# Patient Record
Sex: Female | Born: 1975 | Race: Black or African American | Hispanic: No | Marital: Single | Smoking: Current every day smoker
Health system: Southern US, Community
[De-identification: ages and names within clinical notes are randomized; demographics above are authoritative.]

## PROBLEM LIST (undated history)

## (undated) DIAGNOSIS — F32A Depression, unspecified: Secondary | ICD-10-CM

## (undated) DIAGNOSIS — F419 Anxiety disorder, unspecified: Secondary | ICD-10-CM

## (undated) DIAGNOSIS — F329 Major depressive disorder, single episode, unspecified: Secondary | ICD-10-CM

---

## 2007-04-03 ENCOUNTER — Emergency Department: Payer: Self-pay | Admitting: Emergency Medicine

## 2007-04-06 ENCOUNTER — Emergency Department: Payer: Self-pay | Admitting: Emergency Medicine

## 2007-08-31 ENCOUNTER — Observation Stay: Payer: Self-pay | Admitting: Obstetrics and Gynecology

## 2007-11-08 ENCOUNTER — Ambulatory Visit: Payer: Self-pay | Admitting: Family Medicine

## 2007-11-09 ENCOUNTER — Inpatient Hospital Stay: Payer: Self-pay | Admitting: Obstetrics and Gynecology

## 2008-01-22 ENCOUNTER — Ambulatory Visit: Payer: Self-pay | Admitting: Unknown Physician Specialty

## 2009-05-27 ENCOUNTER — Emergency Department: Payer: Self-pay | Admitting: Emergency Medicine

## 2009-08-15 ENCOUNTER — Emergency Department: Payer: Self-pay | Admitting: Emergency Medicine

## 2010-01-26 ENCOUNTER — Emergency Department: Payer: Self-pay | Admitting: Internal Medicine

## 2010-02-19 ENCOUNTER — Emergency Department: Payer: Self-pay | Admitting: Emergency Medicine

## 2010-02-20 ENCOUNTER — Emergency Department: Payer: Self-pay | Admitting: Emergency Medicine

## 2011-04-02 ENCOUNTER — Emergency Department: Payer: Self-pay | Admitting: Emergency Medicine

## 2011-10-28 ENCOUNTER — Emergency Department: Payer: Self-pay | Admitting: Emergency Medicine

## 2011-10-28 LAB — COMPREHENSIVE METABOLIC PANEL
Albumin: 3.8 g/dL (ref 3.4–5.0)
Alkaline Phosphatase: 77 U/L (ref 50–136)
Anion Gap: 9 (ref 7–16)
BUN: 7 mg/dL (ref 7–18)
Bilirubin,Total: 0.4 mg/dL (ref 0.2–1.0)
Calcium, Total: 8.7 mg/dL (ref 8.5–10.1)
Chloride: 106 mmol/L (ref 98–107)
Co2: 25 mmol/L (ref 21–32)
Creatinine: 0.67 mg/dL (ref 0.60–1.30)
EGFR (African American): >60
EGFR (Non-African Amer.): >60
Glucose: 66 mg/dL (ref 65–99)
Osmolality: 276 (ref 275–301)
Potassium: 3.6 mmol/L (ref 3.5–5.1)
SGOT(AST): 19 U/L (ref 15–37)
SGPT (ALT): 15 U/L
Sodium: 140 mmol/L (ref 136–145)
Total Protein: 7.2 g/dL (ref 6.4–8.2)

## 2011-10-28 LAB — TROPONIN I: Troponin-I: <0.02 ng/mL

## 2011-10-28 LAB — CBC
HCT: 43.9 % (ref 35.0–47.0)
HGB: 14.4 g/dL (ref 12.0–16.0)
MCH: 29.5 pg (ref 26.0–34.0)
MCHC: 32.7 g/dL (ref 32.0–36.0)
MCV: 90 fL (ref 80–100)
Platelet: 202 10*3/uL (ref 150–440)
RBC: 4.87 10*6/uL (ref 3.80–5.20)
RDW: 13.8 % (ref 11.5–14.5)
WBC: 10.7 10*3/uL (ref 3.6–11.0)

## 2011-10-28 LAB — CK TOTAL AND CKMB (NOT AT ARMC)
CK, Total: 74 U/L (ref 21–215)
CK-MB: <0.5 ng/mL — ABNORMAL LOW (ref 0.5–3.6)

## 2012-01-15 ENCOUNTER — Emergency Department: Payer: Self-pay | Admitting: Unknown Physician Specialty

## 2012-01-15 LAB — URINALYSIS, COMPLETE
Bacteria: NONE SEEN
Bilirubin,UR: NEGATIVE
Blood: NEGATIVE
Glucose,UR: NEGATIVE mg/dL (ref 0–75)
Ketone: NEGATIVE
Leukocyte Esterase: NEGATIVE
Nitrite: NEGATIVE
Ph: 7 (ref 4.5–8.0)
Protein: NEGATIVE
RBC,UR: 1 /HPF (ref 0–5)
Specific Gravity: 1.01 (ref 1.003–1.030)
Squamous Epithelial: 6
WBC UR: <1 /HPF (ref 0–5)

## 2012-01-15 LAB — DRUG SCREEN, URINE
Amphetamines, Ur Screen: NEGATIVE (ref ?–1000)
Barbiturates, Ur Screen: NEGATIVE (ref ?–200)
Benzodiazepine, Ur Scrn: NEGATIVE (ref ?–200)
Cannabinoid 50 Ng, Ur ~~LOC~~: POSITIVE (ref ?–50)
Cocaine Metabolite,Ur ~~LOC~~: NEGATIVE (ref ?–300)
MDMA (Ecstasy)Ur Screen: NEGATIVE (ref ?–500)
Methadone, Ur Screen: NEGATIVE (ref ?–300)
Opiate, Ur Screen: NEGATIVE (ref ?–300)
Phencyclidine (PCP) Ur S: NEGATIVE (ref ?–25)
Tricyclic, Ur Screen: NEGATIVE (ref ?–1000)

## 2012-01-15 LAB — CBC
HCT: 38.6 % (ref 35.0–47.0)
HGB: 12.7 g/dL (ref 12.0–16.0)
MCH: 28.9 pg (ref 26.0–34.0)
MCHC: 32.9 g/dL (ref 32.0–36.0)
MCV: 88 fL (ref 80–100)
Platelet: 218 10*3/uL (ref 150–440)
RBC: 4.39 10*6/uL (ref 3.80–5.20)
RDW: 13.2 % (ref 11.5–14.5)
WBC: 11.4 10*3/uL — ABNORMAL HIGH (ref 3.6–11.0)

## 2012-01-15 LAB — COMPREHENSIVE METABOLIC PANEL
Albumin: 3.8 g/dL (ref 3.4–5.0)
Alkaline Phosphatase: 92 U/L (ref 50–136)
Anion Gap: 10 (ref 7–16)
BUN: 8 mg/dL (ref 7–18)
Bilirubin,Total: 0.9 mg/dL (ref 0.2–1.0)
Calcium, Total: 8.7 mg/dL (ref 8.5–10.1)
Chloride: 104 mmol/L (ref 98–107)
Co2: 24 mmol/L (ref 21–32)
Creatinine: 0.6 mg/dL (ref 0.60–1.30)
EGFR (African American): >60
EGFR (Non-African Amer.): >60
Glucose: 96 mg/dL (ref 65–99)
Osmolality: 274 (ref 275–301)
Potassium: 3.1 mmol/L — ABNORMAL LOW (ref 3.5–5.1)
SGOT(AST): 19 U/L (ref 15–37)
SGPT (ALT): 13 U/L
Sodium: 138 mmol/L (ref 136–145)
Total Protein: 7.6 g/dL (ref 6.4–8.2)

## 2012-01-15 LAB — LIPASE, BLOOD: Lipase: 57 U/L — ABNORMAL LOW (ref 73–393)

## 2012-01-15 LAB — PREGNANCY, URINE: Pregnancy Test, Urine: POSITIVE m[IU]/mL

## 2012-01-19 ENCOUNTER — Emergency Department: Payer: Self-pay | Admitting: Emergency Medicine

## 2012-01-19 LAB — URINALYSIS, COMPLETE
Bacteria: NONE SEEN
Bilirubin,UR: NEGATIVE
Blood: NEGATIVE
Glucose,UR: NEGATIVE mg/dL (ref 0–75)
Nitrite: NEGATIVE
Ph: 5 (ref 4.5–8.0)
Protein: 30
RBC,UR: 5 /HPF (ref 0–5)
Specific Gravity: 1.014 (ref 1.003–1.030)
Squamous Epithelial: 15
WBC UR: 4 /HPF (ref 0–5)

## 2012-04-19 ENCOUNTER — Emergency Department: Payer: Self-pay | Admitting: Emergency Medicine

## 2012-04-19 LAB — URINALYSIS, COMPLETE
Bacteria: NONE SEEN
Bilirubin,UR: NEGATIVE
Blood: NEGATIVE
Glucose,UR: NEGATIVE mg/dL (ref 0–75)
Ketone: NEGATIVE
Leukocyte Esterase: NEGATIVE
Nitrite: NEGATIVE
Ph: 9 (ref 4.5–8.0)
Protein: NEGATIVE
RBC,UR: 1 /HPF (ref 0–5)
Specific Gravity: 1.01 (ref 1.003–1.030)
Squamous Epithelial: 1
WBC UR: 2 /HPF (ref 0–5)

## 2012-05-24 ENCOUNTER — Emergency Department: Payer: Self-pay | Admitting: Internal Medicine

## 2012-05-24 LAB — URINALYSIS, COMPLETE
Bilirubin,UR: NEGATIVE
Glucose,UR: NEGATIVE mg/dL (ref 0–75)
Ketone: NEGATIVE
Nitrite: NEGATIVE
Ph: 6 (ref 4.5–8.0)
Protein: NEGATIVE
RBC,UR: 10 /HPF (ref 0–5)
Specific Gravity: 1.02 (ref 1.003–1.030)
Squamous Epithelial: 2
Transitional Epi: 1
WBC UR: 34 /HPF (ref 0–5)

## 2012-05-24 LAB — COMPREHENSIVE METABOLIC PANEL
Albumin: 3.7 g/dL (ref 3.4–5.0)
Alkaline Phosphatase: 102 U/L (ref 50–136)
Anion Gap: 7 (ref 7–16)
BUN: 9 mg/dL (ref 7–18)
Bilirubin,Total: 0.4 mg/dL (ref 0.2–1.0)
Calcium, Total: 8.3 mg/dL — ABNORMAL LOW (ref 8.5–10.1)
Chloride: 107 mmol/L (ref 98–107)
Co2: 24 mmol/L (ref 21–32)
Creatinine: 0.57 mg/dL — ABNORMAL LOW (ref 0.60–1.30)
EGFR (African American): >60
EGFR (Non-African Amer.): >60
Glucose: 87 mg/dL (ref 65–99)
Osmolality: 274 (ref 275–301)
Potassium: 3.3 mmol/L — ABNORMAL LOW (ref 3.5–5.1)
SGOT(AST): 25 U/L (ref 15–37)
SGPT (ALT): 21 U/L (ref 12–78)
Sodium: 138 mmol/L (ref 136–145)
Total Protein: 7 g/dL (ref 6.4–8.2)

## 2012-05-24 LAB — CBC
HCT: 39.2 % (ref 35.0–47.0)
HGB: 12.5 g/dL (ref 12.0–16.0)
MCH: 28.1 pg (ref 26.0–34.0)
MCHC: 32 g/dL (ref 32.0–36.0)
MCV: 88 fL (ref 80–100)
Platelet: 196 10*3/uL (ref 150–440)
RBC: 4.46 10*6/uL (ref 3.80–5.20)
RDW: 13.8 % (ref 11.5–14.5)
WBC: 10.8 10*3/uL (ref 3.6–11.0)

## 2012-05-24 LAB — LIPASE, BLOOD: Lipase: 88 U/L (ref 73–393)

## 2012-07-05 HISTORY — PX: FACIAL COSMETIC SURGERY: SHX629

## 2012-11-12 DIAGNOSIS — F4310 Post-traumatic stress disorder, unspecified: Secondary | ICD-10-CM | POA: Insufficient documentation

## 2012-11-12 DIAGNOSIS — F1210 Cannabis abuse, uncomplicated: Secondary | ICD-10-CM | POA: Insufficient documentation

## 2012-11-12 DIAGNOSIS — F419 Anxiety disorder, unspecified: Secondary | ICD-10-CM | POA: Insufficient documentation

## 2012-11-12 DIAGNOSIS — F329 Major depressive disorder, single episode, unspecified: Secondary | ICD-10-CM | POA: Insufficient documentation

## 2013-01-19 DIAGNOSIS — S0292XA Unspecified fracture of facial bones, initial encounter for closed fracture: Secondary | ICD-10-CM | POA: Insufficient documentation

## 2013-05-03 ENCOUNTER — Emergency Department: Payer: Self-pay | Admitting: Emergency Medicine

## 2013-05-03 LAB — BASIC METABOLIC PANEL
Anion Gap: 6 — ABNORMAL LOW (ref 7–16)
BUN: 11 mg/dL (ref 7–18)
Calcium, Total: 8.8 mg/dL (ref 8.5–10.1)
Chloride: 105 mmol/L (ref 98–107)
Co2: 27 mmol/L (ref 21–32)
Creatinine: 0.54 mg/dL — ABNORMAL LOW (ref 0.60–1.30)
EGFR (African American): >60
EGFR (Non-African Amer.): >60
Glucose: 110 mg/dL — ABNORMAL HIGH (ref 65–99)
Osmolality: 276 (ref 275–301)
Potassium: 3.1 mmol/L — ABNORMAL LOW (ref 3.5–5.1)
Sodium: 138 mmol/L (ref 136–145)

## 2013-05-03 LAB — CBC WITH DIFFERENTIAL/PLATELET
Basophil #: 0 10*3/uL (ref 0.0–0.1)
Basophil %: 0.2 %
Eosinophil #: 0 10*3/uL (ref 0.0–0.7)
Eosinophil %: 0.2 %
HCT: 40.3 % (ref 35.0–47.0)
HGB: 14 g/dL (ref 12.0–16.0)
Lymphocyte #: 1.2 10*3/uL (ref 1.0–3.6)
Lymphocyte %: 5.7 %
MCH: 29.4 pg (ref 26.0–34.0)
MCHC: 34.7 g/dL (ref 32.0–36.0)
MCV: 85 fL (ref 80–100)
Monocyte #: 0.9 x10 3/mm (ref 0.2–0.9)
Monocyte %: 4.2 %
Neutrophil #: 18.4 10*3/uL — ABNORMAL HIGH (ref 1.4–6.5)
Neutrophil %: 89.7 %
Platelet: 236 10*3/uL (ref 150–440)
RBC: 4.76 10*6/uL (ref 3.80–5.20)
RDW: 14.4 % (ref 11.5–14.5)
WBC: 20.5 10*3/uL — ABNORMAL HIGH (ref 3.6–11.0)

## 2013-07-31 ENCOUNTER — Emergency Department: Payer: Self-pay | Admitting: Emergency Medicine

## 2013-12-20 DIAGNOSIS — L720 Epidermal cyst: Secondary | ICD-10-CM | POA: Insufficient documentation

## 2013-12-22 ENCOUNTER — Emergency Department: Payer: Self-pay | Admitting: Emergency Medicine

## 2014-02-22 DIAGNOSIS — D369 Benign neoplasm, unspecified site: Secondary | ICD-10-CM | POA: Insufficient documentation

## 2014-04-21 ENCOUNTER — Emergency Department: Payer: Self-pay | Admitting: Emergency Medicine

## 2014-04-21 LAB — URINALYSIS, COMPLETE
Bacteria: NONE SEEN
Bilirubin,UR: NEGATIVE
Blood: NEGATIVE
Glucose,UR: NEGATIVE mg/dL (ref 0–75)
Ketone: NEGATIVE
Leukocyte Esterase: NEGATIVE
Nitrite: NEGATIVE
Ph: 7 (ref 4.5–8.0)
Protein: NEGATIVE
RBC,UR: <1 /HPF (ref 0–5)
Specific Gravity: 1.002 (ref 1.003–1.030)
Squamous Epithelial: <1
WBC UR: <1 /HPF (ref 0–5)

## 2014-07-28 ENCOUNTER — Emergency Department: Payer: Self-pay | Admitting: Emergency Medicine

## 2014-07-30 ENCOUNTER — Emergency Department: Payer: Self-pay | Admitting: Internal Medicine

## 2014-07-30 LAB — DRUG SCREEN, URINE
Amphetamines, Ur Screen: NEGATIVE (ref ?–1000)
Barbiturates, Ur Screen: NEGATIVE (ref ?–200)
Benzodiazepine, Ur Scrn: NEGATIVE (ref ?–200)
Cannabinoid 50 Ng, Ur ~~LOC~~: POSITIVE (ref ?–50)
Cocaine Metabolite,Ur ~~LOC~~: NEGATIVE (ref ?–300)
MDMA (Ecstasy)Ur Screen: NEGATIVE (ref ?–500)
Methadone, Ur Screen: NEGATIVE (ref ?–300)
Opiate, Ur Screen: NEGATIVE (ref ?–300)
Phencyclidine (PCP) Ur S: NEGATIVE (ref ?–25)
Tricyclic, Ur Screen: NEGATIVE (ref ?–1000)

## 2014-07-30 LAB — COMPREHENSIVE METABOLIC PANEL
Albumin: 3.9 g/dL (ref 3.4–5.0)
Alkaline Phosphatase: 89 U/L (ref 46–116)
Anion Gap: 8 (ref 7–16)
BUN: 10 mg/dL (ref 7–18)
Bilirubin,Total: 0.3 mg/dL (ref 0.2–1.0)
Calcium, Total: 8.7 mg/dL (ref 8.5–10.1)
Chloride: 105 mmol/L (ref 98–107)
Co2: 25 mmol/L (ref 21–32)
Creatinine: 0.74 mg/dL (ref 0.60–1.30)
EGFR (African American): >60
EGFR (Non-African Amer.): >60
Glucose: 87 mg/dL (ref 65–99)
Osmolality: 274 (ref 275–301)
Potassium: 3.5 mmol/L (ref 3.5–5.1)
SGOT(AST): 24 U/L (ref 15–37)
SGPT (ALT): 17 U/L (ref 14–63)
Sodium: 138 mmol/L (ref 136–145)
Total Protein: 7.2 g/dL (ref 6.4–8.2)

## 2014-07-30 LAB — URINALYSIS, COMPLETE
Bacteria: NONE SEEN
Bilirubin,UR: NEGATIVE
Blood: NEGATIVE
Glucose,UR: NEGATIVE mg/dL (ref 0–75)
Hyaline Cast: 14
Ketone: NEGATIVE
Leukocyte Esterase: NEGATIVE
Nitrite: NEGATIVE
Ph: 6 (ref 4.5–8.0)
Protein: NEGATIVE
RBC,UR: 3 /HPF (ref 0–5)
Specific Gravity: 1.016 (ref 1.003–1.030)
Squamous Epithelial: <1
WBC UR: 3 /HPF (ref 0–5)

## 2014-07-30 LAB — CBC
HCT: 42.7 % (ref 35.0–47.0)
HGB: 13.9 g/dL (ref 12.0–16.0)
MCH: 28.5 pg (ref 26.0–34.0)
MCHC: 32.7 g/dL (ref 32.0–36.0)
MCV: 87 fL (ref 80–100)
Platelet: 194 10*3/uL (ref 150–440)
RBC: 4.89 10*6/uL (ref 3.80–5.20)
RDW: 13.6 % (ref 11.5–14.5)
WBC: 7.9 10*3/uL (ref 3.6–11.0)

## 2014-07-30 LAB — D-DIMER(ARMC): D-Dimer: 337 ng/ml

## 2014-07-30 LAB — TSH: Thyroid Stimulating Horm: 0.648 u[IU]/mL

## 2014-07-30 LAB — TROPONIN I: Troponin-I: <0.02 ng/mL

## 2014-09-23 DIAGNOSIS — F32A Depression, unspecified: Secondary | ICD-10-CM | POA: Insufficient documentation

## 2014-09-24 ENCOUNTER — Emergency Department: Payer: Self-pay | Admitting: Emergency Medicine

## 2015-03-29 IMAGING — CT CT HEAD WITHOUT CONTRAST
1 series · 16 of 30 positions shown, 20 images · non-contrast
Comparison: 04/19/2012.

CLINICAL DATA: 38-year-old female with episode of becoming hot and
dizzy and found lying on floor. No trauma. No loss of consciousness.
Initial encounter.

EXAM:
CT HEAD WITHOUT CONTRAST
TECHNIQUE: Contiguous axial images were obtained from the base of the skull
through the vertex without intravenous contrast.

[Series 2: soft tissue · axial · 0.38mm/px · z∈[-161,-26]mm · 16 of 31 slices shown, 20 images]
[im 2/31  brain]
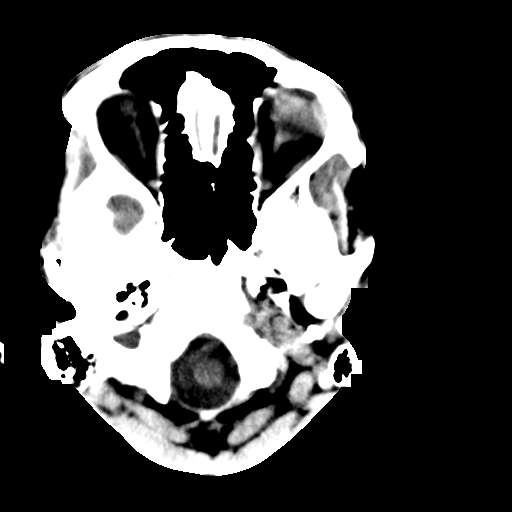
[im 2/31  bone]
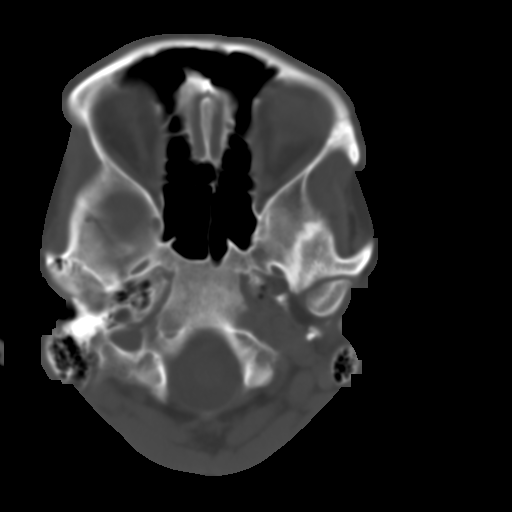
[im 4/31  brain]
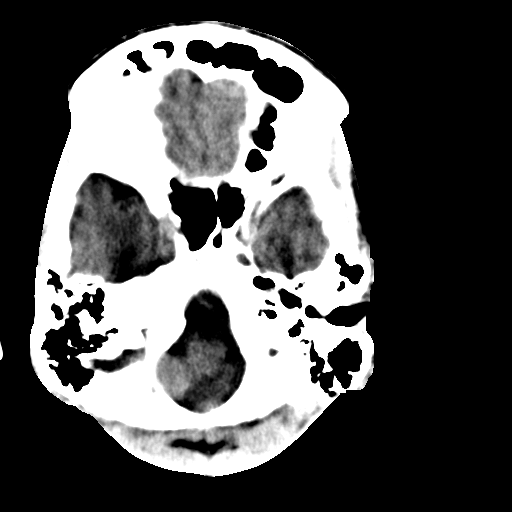
[im 6/31  brain]
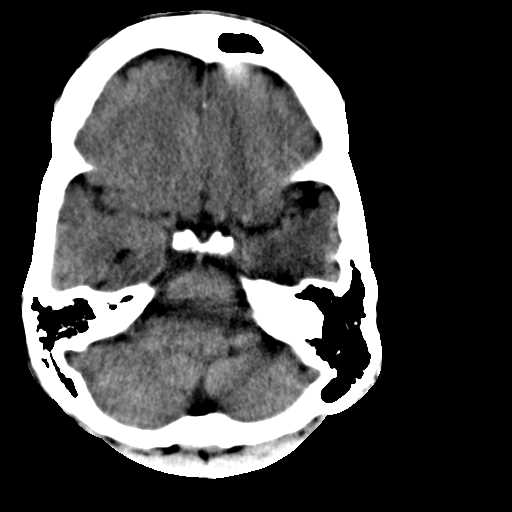
[im 8/31  brain]
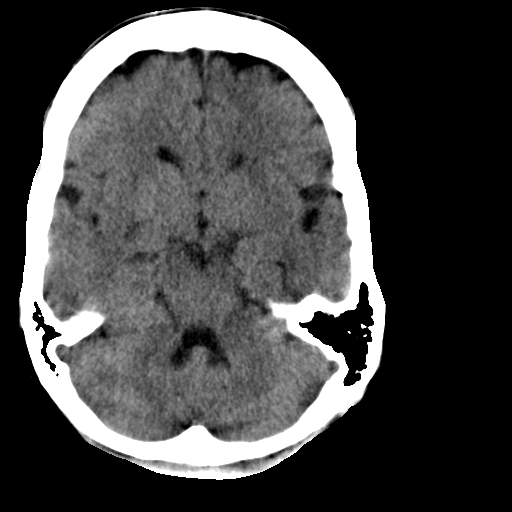
[im 9/31  brain]
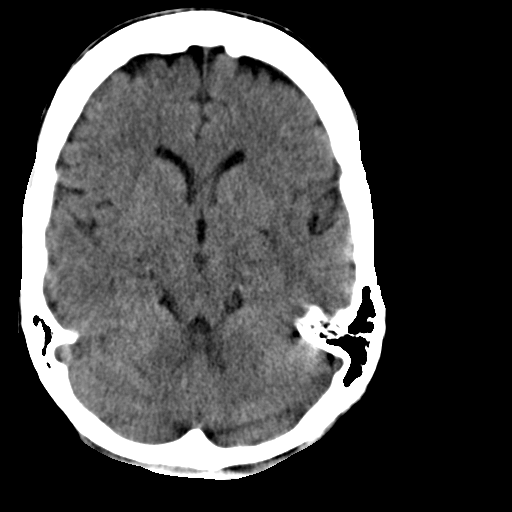
[im 9/31  bone]
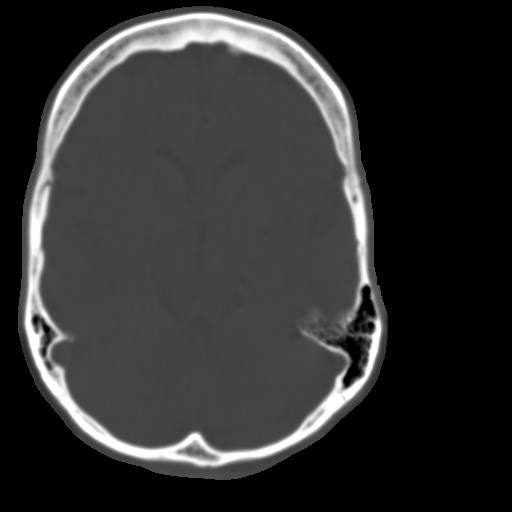
[im 11/31  brain]
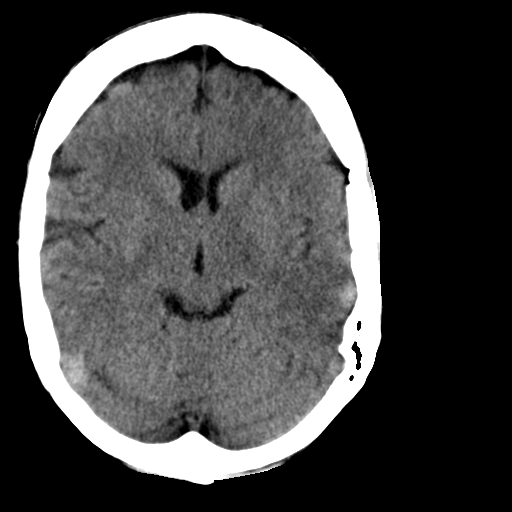
[im 13/31  brain]
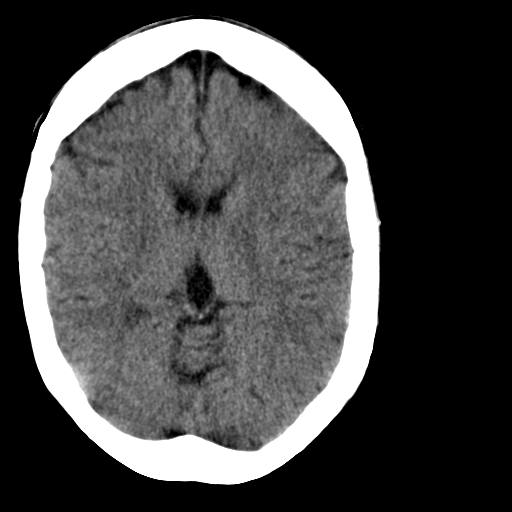
[im 15/31  brain]
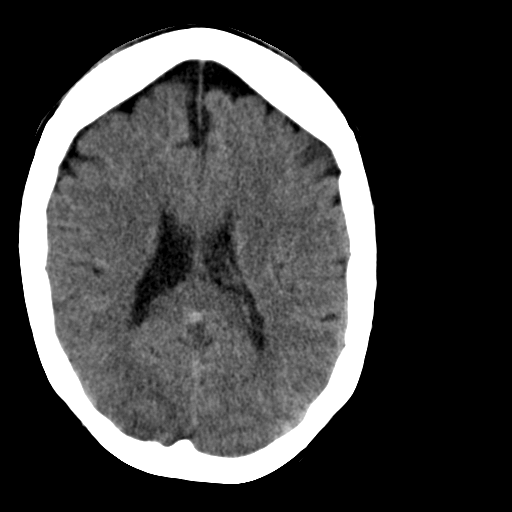
[im 16/31  brain]
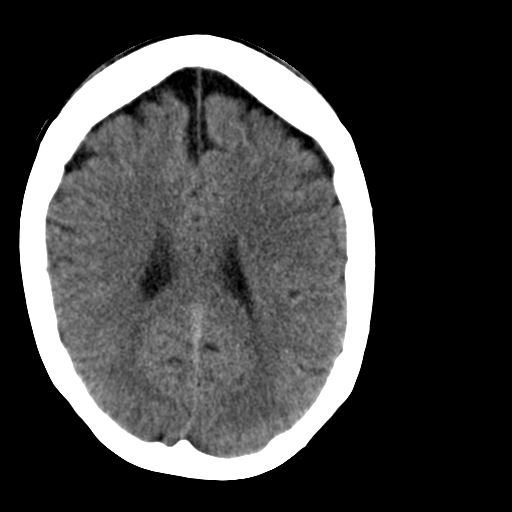
[im 16/31  bone]
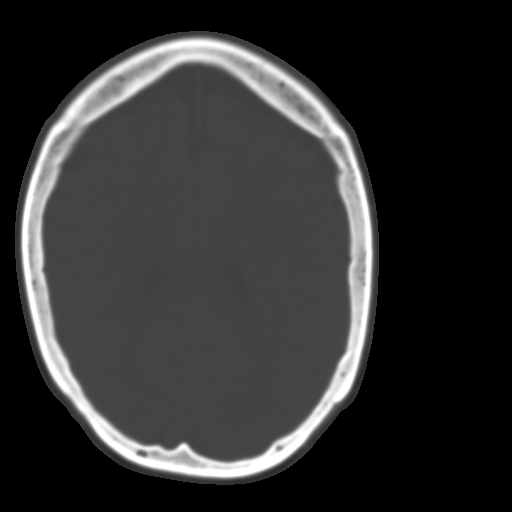
[im 18/31  brain]
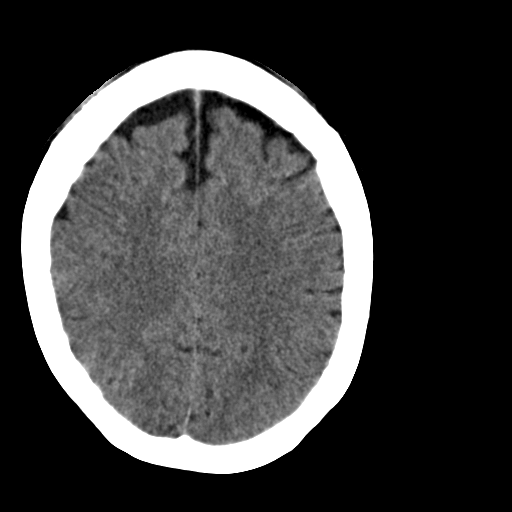
[im 20/31  brain]
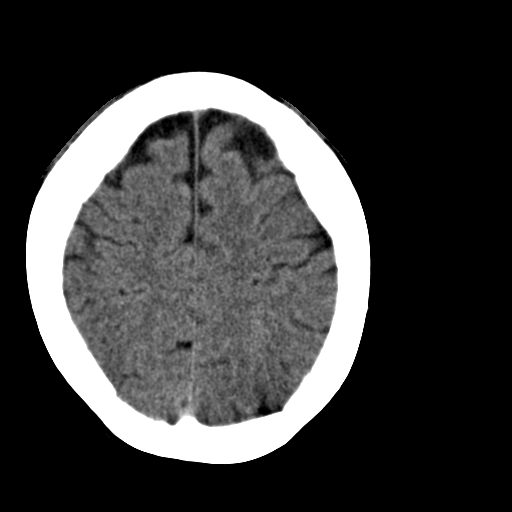
[im 22/31  brain]
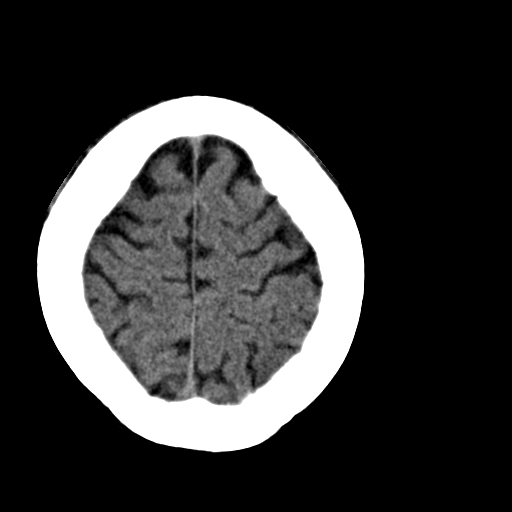
[im 23/31  brain]
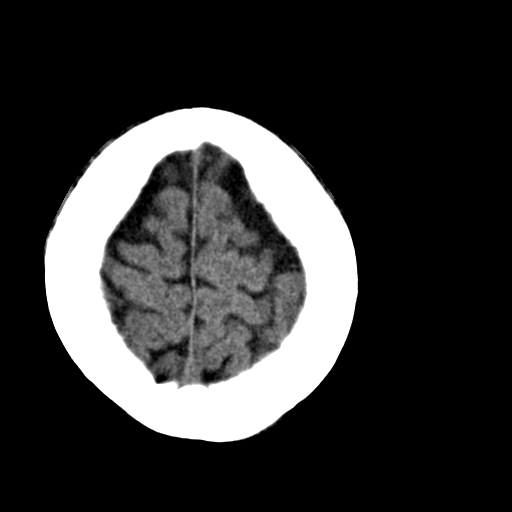
[im 23/31  bone]
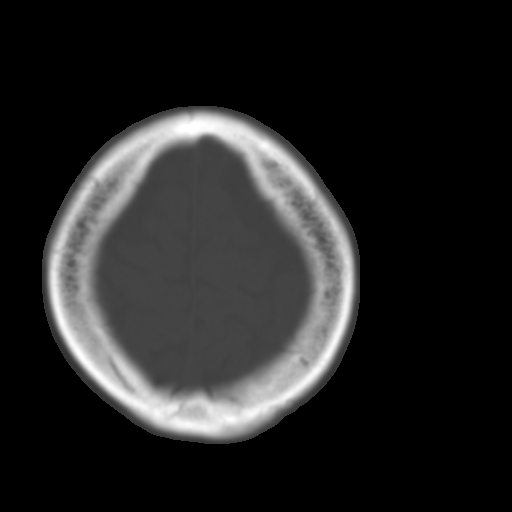
[im 25/31  brain]
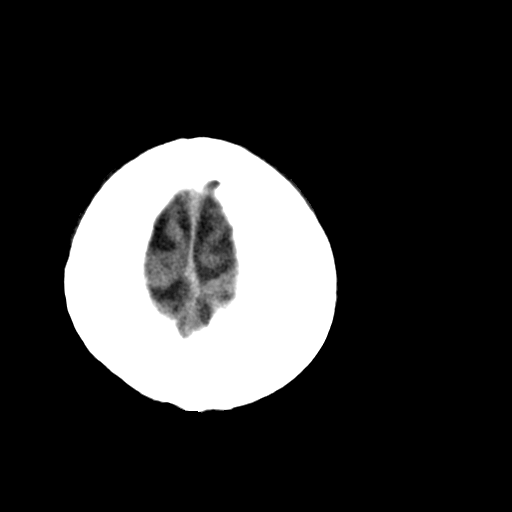
[im 27/31  brain]
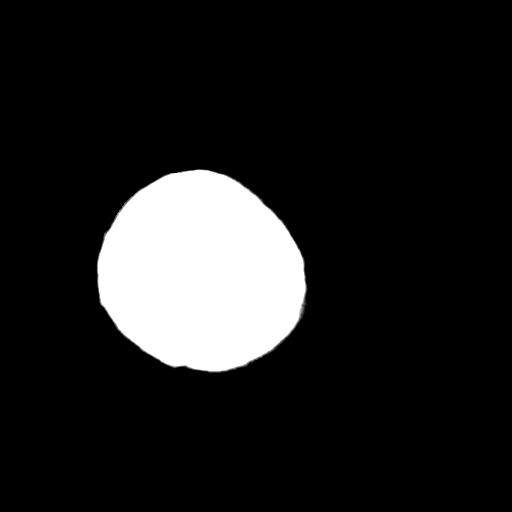
[im 29/31  brain]
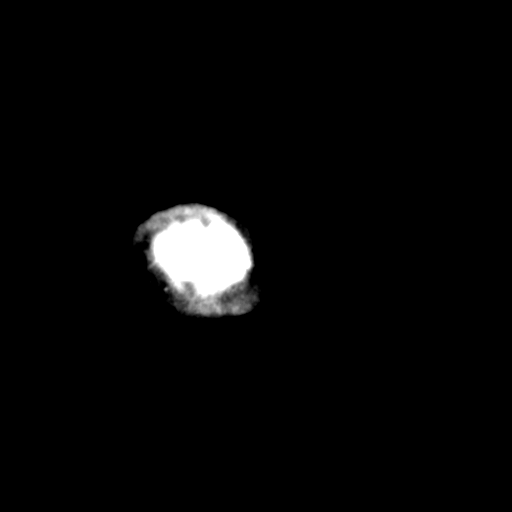

[16 of 30 positions shown; findings below may reference images not displayed]

FINDINGS: Evaluation slightly limited by artifact caused by patient's
calvarium/motion. Taking this limitation into account, no
intracranial hemorrhage is noted.

No CT evidence of large acute infarct.

No hydrocephalus.

No intracranial mass lesion noted on this unenhanced exam.

Mastoid air cells, middle ear cavities and visualized paranasal
sinuses are clear.
IMPRESSION: Evaluation slightly limited by artifact caused by patient's
calvarium/motion. Taking this limitation into account, no
intracranial hemorrhage is noted.

No CT evidence of large acute infarct.

## 2015-03-29 IMAGING — CR DG CHEST 2V
1 series · 2 of 2 positions shown · non-contrast
Comparison: 05/27/2009

CLINICAL DATA: Shortness of breath for 2 days, cough, dizziness at
work today

EXAM:
CHEST  2 VIEW

[Series 1: w chest pa · 0.14mm/px · 2 of 2 slices shown]
[im 1/2]
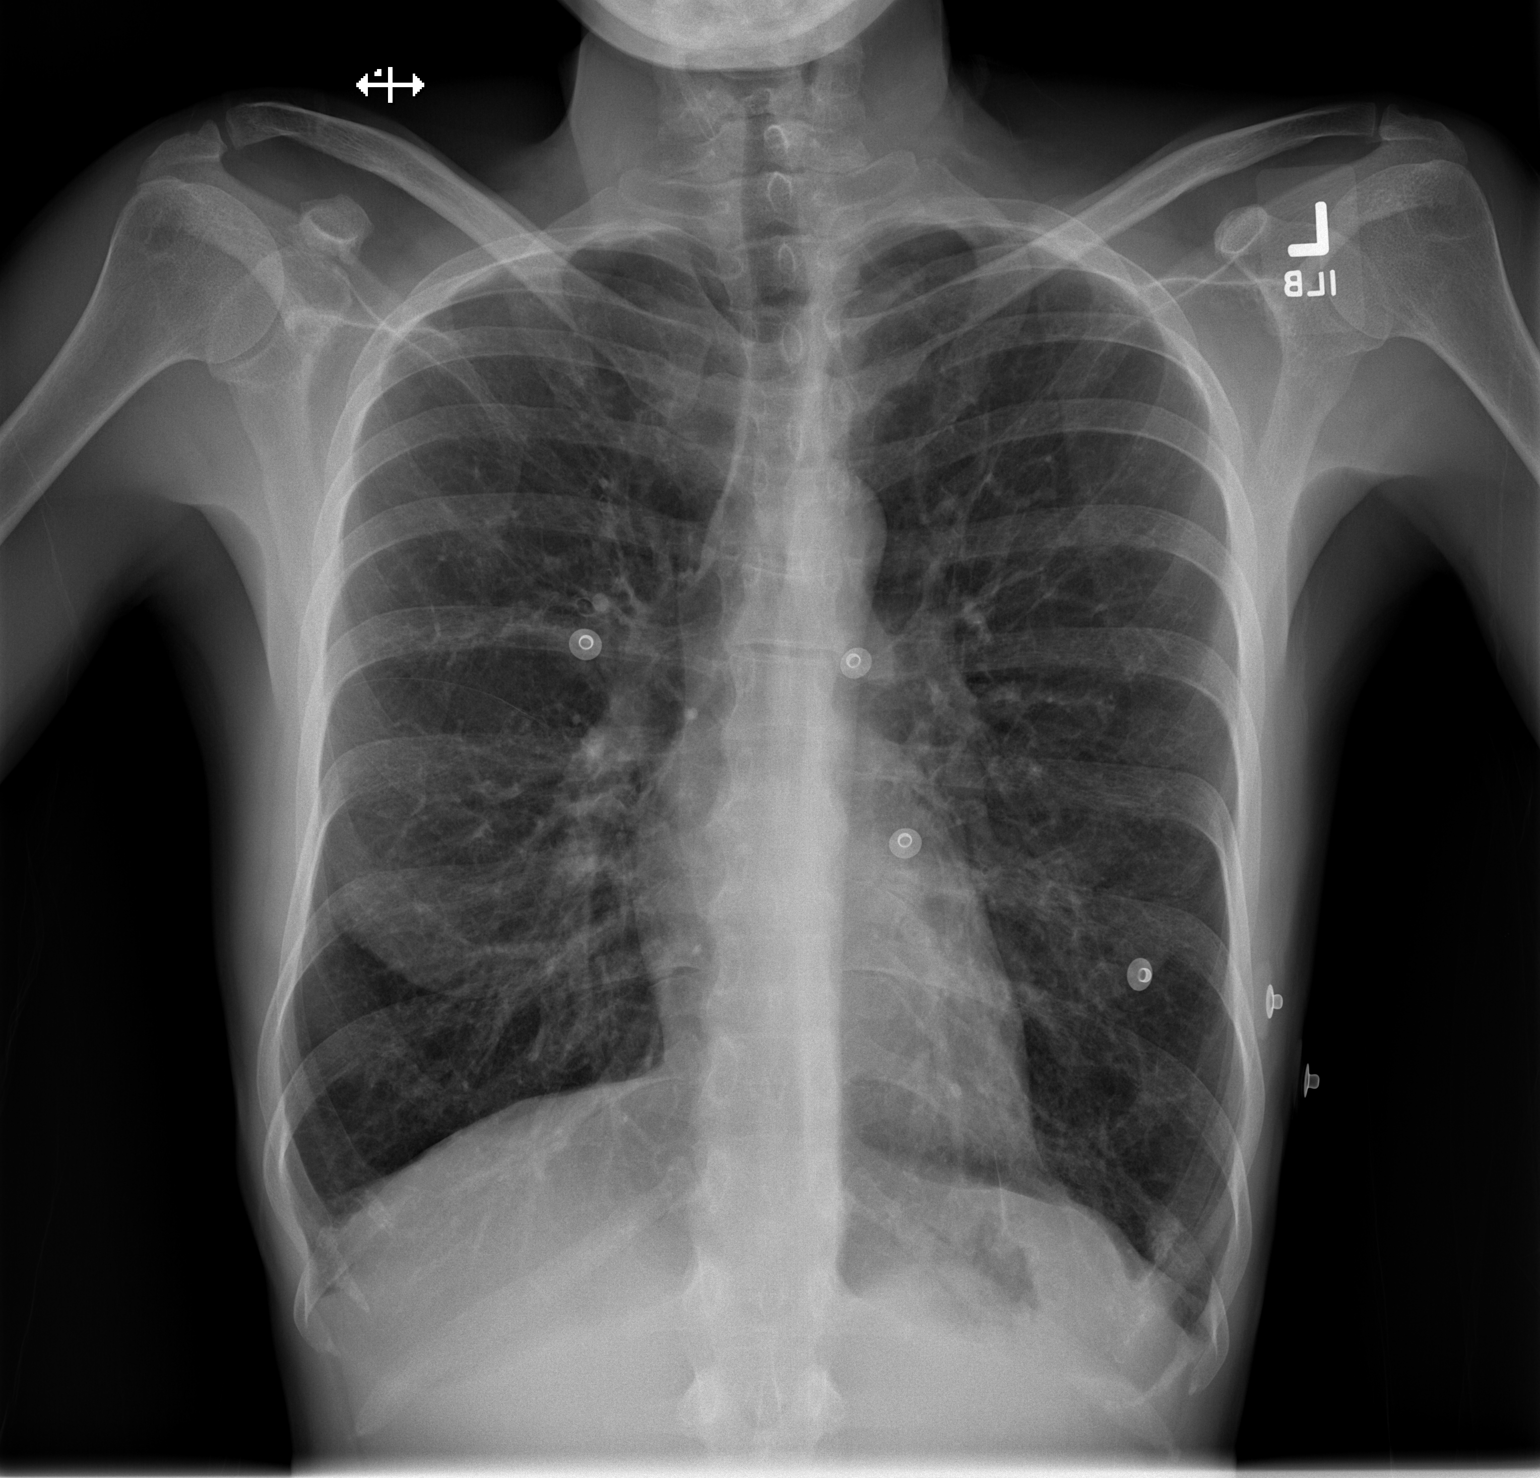
[im 2/2]
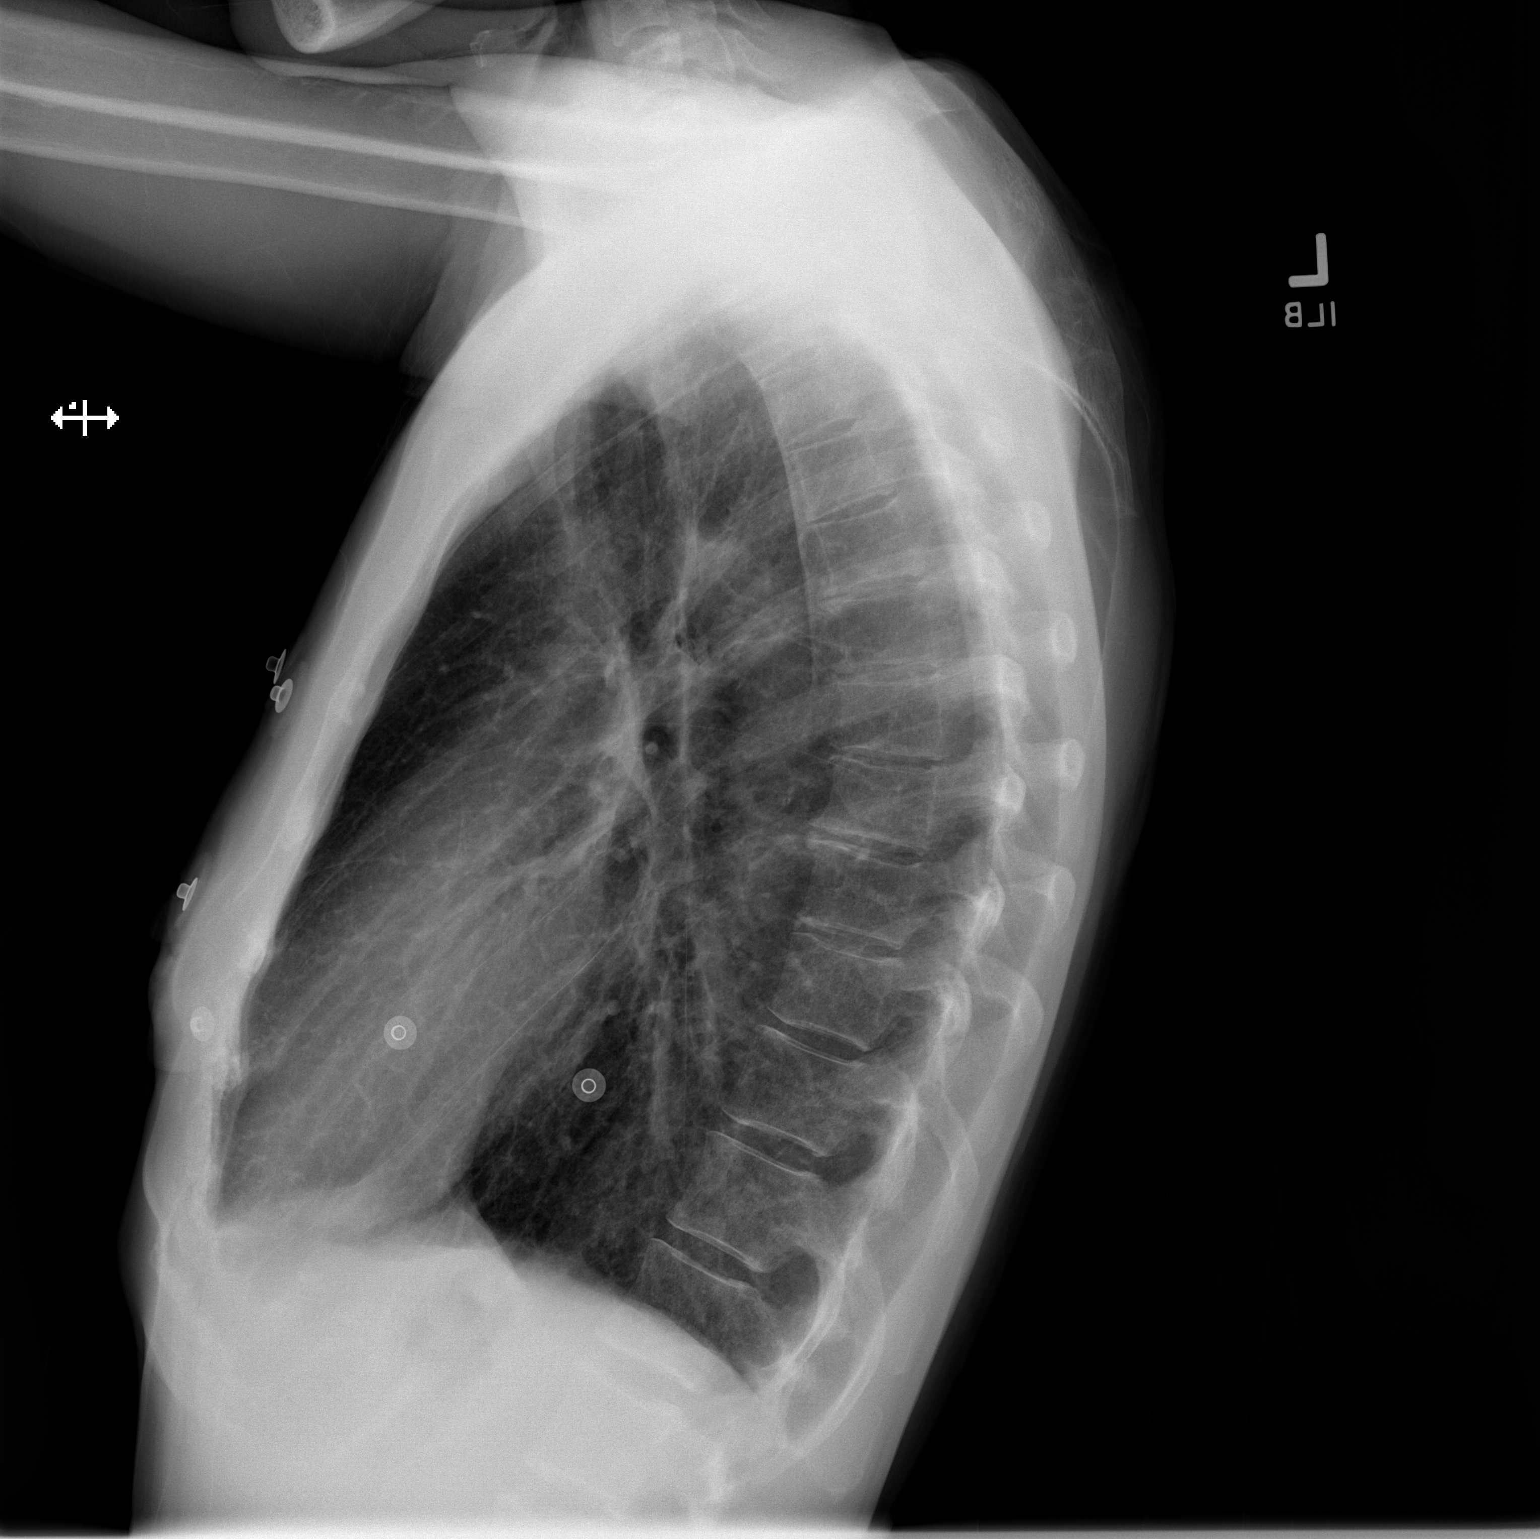

[2 of 2 positions shown; findings below may reference images not displayed]

FINDINGS: Cardiomediastinal silhouette is stable. No acute infiltrate or
pleural effusion. No pulmonary edema. Hyperinflation again noted.
Mild degenerative changes mid thoracic spine.
IMPRESSION: No active cardiopulmonary disease.

## 2015-05-25 ENCOUNTER — Encounter (HOSPITAL_COMMUNITY): Payer: Self-pay

## 2015-05-25 ENCOUNTER — Emergency Department (HOSPITAL_COMMUNITY)
Admission: EM | Admit: 2015-05-25 | Discharge: 2015-05-25 | Disposition: A | Discharge status: Home / Self Care | Payer: Medicaid Other | Attending: Emergency Medicine | Admitting: Emergency Medicine

## 2015-05-25 DIAGNOSIS — M545 Low back pain, unspecified: Secondary | ICD-10-CM

## 2015-05-25 DIAGNOSIS — Z8659 Personal history of other mental and behavioral disorders: Secondary | ICD-10-CM | POA: Insufficient documentation

## 2015-05-25 DIAGNOSIS — F17210 Nicotine dependence, cigarettes, uncomplicated: Secondary | ICD-10-CM | POA: Insufficient documentation

## 2015-05-25 HISTORY — DX: Depression, unspecified: F32.A

## 2015-05-25 HISTORY — DX: Major depressive disorder, single episode, unspecified: F32.9

## 2015-05-25 HISTORY — DX: Anxiety disorder, unspecified: F41.9

## 2015-05-25 MED ORDER — METHOCARBAMOL 500 MG PO TABS
500.0000 mg | ORAL_TABLET | Freq: Once | ORAL | Status: AC
  Administered 2015-05-25: 500 mg via ORAL
  Filled 2015-05-25: qty 1

## 2015-05-25 MED ORDER — IBUPROFEN 400 MG PO TABS
400.0000 mg | ORAL_TABLET | Freq: Once | ORAL | Status: AC
  Administered 2015-05-25: 400 mg via ORAL
  Filled 2015-05-25: qty 1

## 2015-05-25 MED ORDER — METHOCARBAMOL 500 MG PO TABS: 1000.0000 mg | ORAL_TABLET | Freq: Four times a day (QID) | ORAL | Status: DC | PRN

## 2015-05-25 NOTE — Discharge Instructions (Signed)
For pain control you may take up to  of Motrin (also known as ibuprofen). That is usually 4 over the counter pills,  3 times a day. Take with food to minimize stomach irritation   For breakthrough pain you may take Robaxin. Do not drink alcohol, drive or operate heavy machinery when taking Robaxin.  Please follow with your primary care doctor in the next 2 days for a check-up. They must obtain records for further management.   Do not hesitate to return to the Emergency Department for any new, worsening or concerning symptoms.    Back Pain, Adult Back pain is very common in adults.The cause of back pain is rarely dangerous and the pain often gets better over time.The cause of your back pain may not be known. Some common causes of back pain include:  Strain of the muscles or ligaments supporting the spine.  Wear and tear (degeneration) of the spinal disks.  Arthritis.  Direct injury to the back. For many people, back pain may return. Since back pain is rarely dangerous, most people can learn to manage this condition on their own. HOME CARE INSTRUCTIONS Watch your back pain for any changes. The following actions may help to lessen any discomfort you are feeling:  Remain active. It is stressful on your back to sit or stand in one place for long periods of time. Do not sit, drive, or stand in one place for more than 30 minutes at a time. Take short walks on even surfaces as soon as you are able.Try to increase the length of time you walk each day.  Exercise regularly as directed by your health care provider. Exercise helps your back heal faster. It also helps avoid future injury by keeping your muscles strong and flexible.  Do not stay in bed.Resting more than 1-2 days can delay your recovery.  Pay attention to your body when you bend and lift. The most comfortable positions are those that put less stress on your recovering back. Always use proper lifting techniques,  including:  Bending your knees.  Keeping the load close to your body.  Avoiding twisting.  Find a comfortable position to sleep. Use a firm mattress and lie on your side with your knees slightly bent. If you lie on your back, put a pillow under your knees.  Avoid feeling anxious or stressed.Stress increases muscle tension and can worsen back pain.It is important to recognize when you are anxious or stressed and learn ways to manage it, such as with exercise.  Take medicines only as directed by your health care provider. Over-the-counter medicines to reduce pain and inflammation are often the most helpful.Your health care provider may prescribe muscle relaxant drugs.These medicines help dull your pain so you can more quickly return to your normal activities and healthy exercise.  Apply ice to the injured area:  Put ice in a plastic bag.  Place a towel between your skin and the bag.  Leave the ice on for 20 minutes, 2-3 times a day for the first 2-3 days. After that, ice and heat may be alternated to reduce pain and spasms.  Maintain a healthy weight. Excess weight puts extra stress on your back and makes it difficult to maintain good posture. SEEK MEDICAL CARE IF:  You have pain that is not relieved with rest or medicine.  You have increasing pain going down into the legs or buttocks.  You have pain that does not improve in one week.  You have night pain.  You lose weight.  You have a fever or chills. SEEK IMMEDIATE MEDICAL CARE IF:   You develop new bowel or bladder control problems.  You have unusual weakness or numbness in your arms or legs.  You develop nausea or vomiting.  You develop abdominal pain.  You feel faint.   This information is not intended to replace advice given to you by your health care provider. Make sure you discuss any questions you have with your health care provider.   Document Released: 06/21/2005 Document Revised: 07/12/2014 Document  Reviewed: 10/23/2013 Elsevier Interactive Patient Education [employer] Elsevier Inc.

## 2015-05-25 NOTE — ED Notes (Signed)
Pt. Reports severe back pain starting last night. She identifies pain in lumbar region and wrapping around to her sides, with pressure or just sitting still. Pt. Reports pain has kept her from sleeping and from performing ADLs. Pt. Has tried ibuprofen with no relief.
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
## 2015-07-30 ENCOUNTER — Emergency Department (HOSPITAL_COMMUNITY)
Admission: EM | Admit: 2015-07-30 | Discharge: 2015-07-30 | Disposition: A | Discharge status: Home / Self Care | Payer: Medicaid Other | Attending: Emergency Medicine | Admitting: Emergency Medicine

## 2015-07-30 ENCOUNTER — Encounter (HOSPITAL_COMMUNITY): Payer: Self-pay | Admitting: Neurology

## 2015-07-30 DIAGNOSIS — F17210 Nicotine dependence, cigarettes, uncomplicated: Secondary | ICD-10-CM | POA: Diagnosis not present

## 2015-07-30 DIAGNOSIS — R509 Fever, unspecified: Secondary | ICD-10-CM | POA: Diagnosis present

## 2015-07-30 DIAGNOSIS — Z8659 Personal history of other mental and behavioral disorders: Secondary | ICD-10-CM | POA: Diagnosis not present

## 2015-07-30 DIAGNOSIS — J069 Acute upper respiratory infection, unspecified: Secondary | ICD-10-CM | POA: Diagnosis not present

## 2015-07-30 LAB — RAPID STREP SCREEN (MED CTR MEBANE ONLY): Streptococcus, Group A Screen (Direct): NEGATIVE

## 2015-07-30 MED ORDER — IBUPROFEN 800 MG PO TABS
800.0000 mg | ORAL_TABLET | Freq: Once | ORAL | Status: AC
  Administered 2015-07-30: 800 mg via ORAL
  Filled 2015-07-30: qty 1

## 2015-07-30 NOTE — ED Notes (Signed)
Pt is in stable condition upon d/c and ambulates from ed. 

## 2015-07-30 NOTE — ED Notes (Signed)
Pt reports since Friday h/a, sore throat, body aches, fever, left ear ache. Has been feeling nauseated.
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
## 2015-07-30 NOTE — Discharge Instructions (Signed)
Upper Respiratory Infection, Adult °Most upper respiratory infections (URIs) are a viral infection of the air passages leading to the lungs. A URI affects the nose, throat, and upper air passages. The most common type of URI is nasopharyngitis and is typically referred to as "the common cold." °URIs run their course and usually go away on their own. Most of the time, a URI does not require medical attention, but sometimes a bacterial infection in the upper airways can follow a viral infection. This is called a secondary infection. Sinus and middle ear infections are common types of secondary upper respiratory infections. °Bacterial pneumonia can also complicate a URI. A URI can worsen asthma and chronic obstructive pulmonary disease (COPD). Sometimes, these complications can require emergency medical care and may be life threatening.  °CAUSES °Almost all URIs are caused by viruses. A virus is a type of germ and can spread from one person to another.  °RISKS FACTORS °You may be at risk for a URI if:  °· You smoke.   °· You have chronic heart or lung disease. °· You have a weakened defense (immune) system.   °· You are very young or very old.   °· You have nasal allergies or asthma. °· You work in crowded or poorly ventilated areas. °· You work in health care facilities or schools. °SIGNS AND SYMPTOMS  °Symptoms typically develop 2-3 days after you come in contact with a cold virus. Most viral URIs last 7-10 days. However, viral URIs from the influenza virus (flu virus) can last 14-18 days and are typically more severe. Symptoms may include:  °· Runny or stuffy (congested) nose.   °· Sneezing.   °· Cough.   °· Sore throat.   °· Headache.   °· Fatigue.   °· Fever.   °· Loss of appetite.   °· Pain in your forehead, behind your eyes, and over your cheekbones (sinus pain). °· Muscle aches.   °DIAGNOSIS  °Your health care provider may diagnose a URI by: °· Physical exam. °· Tests to check that your symptoms are not due to  another condition such as: °· Strep throat. °· Sinusitis. °· Pneumonia. °· Asthma. °TREATMENT  °A URI goes away on its own with time. It cannot be cured with medicines, but medicines may be prescribed or recommended to relieve symptoms. Medicines may help: °· Reduce your fever. °· Reduce your cough. °· Relieve nasal congestion. °HOME CARE INSTRUCTIONS  °· Take medicines only as directed by your health care provider.   °· Gargle warm saltwater or take cough drops to comfort your throat as directed by your health care provider. °· Use a warm mist humidifier or inhale steam from a shower to increase air moisture. This may make it easier to breathe. °· Drink enough fluid to keep your urine clear or pale yellow.   °· Eat soups and other clear broths and maintain good nutrition.   °· Rest as needed.   °· Return to work when your temperature has returned to normal or as your health care provider advises. You may need to stay home longer to avoid infecting others. You can also use a face mask and careful hand washing to prevent spread of the virus. °· Increase the usage of your inhaler if you have asthma.   °· Do not use any tobacco products, including cigarettes, chewing tobacco, or electronic cigarettes. If you need help quitting, ask your health care provider. °PREVENTION  °The best way to protect yourself from getting a cold is to practice good hygiene.  °· Avoid oral or hand contact with people with cold   symptoms.   °· Wash your hands often if contact occurs.   °There is no clear evidence that vitamin C, vitamin E, echinacea, or exercise reduces the chance of developing a cold. However, it is always recommended to get plenty of rest, exercise, and practice good nutrition.  °SEEK MEDICAL CARE IF:  °· You are getting worse rather than better.   °· Your symptoms are not controlled by medicine.   °· You have chills. °· You have worsening shortness of breath. °· You have brown or red mucus. °· You have yellow or brown nasal  discharge. °· You have pain in your face, especially when you bend forward. °· You have a fever. °· You have swollen neck glands. °· You have pain while swallowing. °· You have white areas in the back of your throat. °SEEK IMMEDIATE MEDICAL CARE IF:  °· You have severe or persistent: °¨ Headache. °¨ Ear pain. °¨ Sinus pain. °¨ Chest pain. °· You have chronic lung disease and any of the following: °¨ Wheezing. °¨ Prolonged cough. °¨ Coughing up blood. °¨ A change in your usual mucus. °· You have a stiff neck. °· You have changes in your: °¨ Vision. °¨ Hearing. °¨ Thinking. °¨ Mood. °MAKE SURE YOU:  °· Understand these instructions. °· Will watch your condition. °· Will get help right away if you are not doing well or get worse. °  °This information is not intended to replace advice given to you by your health care provider. Make sure you discuss any questions you have with your health care provider. °  °Document Released: 12/15/2000 Document Revised: 11/05/2014 Document Reviewed: 09/26/2013 °Elsevier Interactive Patient Education ©2016 [employer] Inc[employer] ° °Viral Infections °A viral infection can be caused by different types of viruses. Most viral infections are not serious and resolve on their own. However, some infections may cause severe symptoms and may lead to further complications. °SYMPTOMS °Viruses can frequently cause: °· Minor sore throat. °· Aches and pains. °· Headaches. °· Runny nose. °· Different types of rashes. °· Watery eyes. °· Tiredness. °· Cough. °· Loss of appetite. °· Gastrointestinal infections, resulting in nausea, vomiting, and diarrhea. °These symptoms do not respond to antibiotics because the infection is not caused by bacteria. However, you might catch a bacterial infection following the viral infection. This is sometimes called a "superinfection." Symptoms of such a bacterial infection may include: °· Worsening sore throat with pus and difficulty swallowing. °· Swollen neck glands. °· Chills  and a high or persistent fever. °· Severe headache. °· Tenderness over the sinuses. °· Persistent overall ill feeling (malaise), muscle aches, and tiredness (fatigue). °· Persistent cough. °· Yellow, green, or brown mucus production with coughing. °HOME CARE INSTRUCTIONS  °· Only take over-the-counter or prescription medicines for pain, discomfort, diarrhea, or fever as directed by your caregiver. °· Drink enough water and fluids to keep your urine clear or pale yellow. Sports drinks can provide valuable electrolytes, sugars, and hydration. °· Get plenty of rest and maintain proper nutrition. Soups and broths with crackers or rice are fine. °SEEK IMMEDIATE MEDICAL CARE IF:  °· You have severe headaches, shortness of breath, chest pain, neck pain, or an unusual rash. °· You have uncontrolled vomiting, diarrhea, or you are unable to keep down fluids. °· You or your child has an oral temperature above 102° F (38.9° C), not controlled by medicine. °· Your baby is older than 3 months with a rectal temperature of 102° F (38.9° C) or higher. °· Your baby is 3 months old   or younger with a rectal temperature of 100.4 F (38 C) or higher. MAKE SURE YOU:   Understand these instructions.  Will watch your condition.  Will get help right away if you are not doing well or get worse.   This information is not intended to replace advice given to you by your health care provider. Make sure you discuss any questions you have with your health care provider.   Follow-up with her primary care provider if symptoms don't improve. Encourage adequate hydration, drink plenty of fluids. Take ibuprofen every 3-4 hours for generalized body aches. Return to the emergency department if you expressed severe worsening of her symptoms, increased fever, chills, neck pain or stiffness, difficulty swelling, difficulty breathing.

## 2015-08-01 LAB — CULTURE, GROUP A STREP (THRC)

## 2015-08-14 ENCOUNTER — Emergency Department (HOSPITAL_COMMUNITY)
Admission: EM | Admit: 2015-08-14 | Discharge: 2015-08-14 | Disposition: A | Discharge status: Home / Self Care | Payer: Medicaid Other | Attending: Emergency Medicine | Admitting: Emergency Medicine

## 2015-08-14 DIAGNOSIS — Z8659 Personal history of other mental and behavioral disorders: Secondary | ICD-10-CM | POA: Diagnosis not present

## 2015-08-14 DIAGNOSIS — Y9372 Activity, wrestling: Secondary | ICD-10-CM | POA: Insufficient documentation

## 2015-08-14 DIAGNOSIS — Y9289 Other specified places as the place of occurrence of the external cause: Secondary | ICD-10-CM | POA: Diagnosis not present

## 2015-08-14 DIAGNOSIS — Y999 Unspecified external cause status: Secondary | ICD-10-CM | POA: Diagnosis not present

## 2015-08-14 DIAGNOSIS — F17210 Nicotine dependence, cigarettes, uncomplicated: Secondary | ICD-10-CM | POA: Diagnosis not present

## 2015-08-14 DIAGNOSIS — S39012A Strain of muscle, fascia and tendon of lower back, initial encounter: Secondary | ICD-10-CM | POA: Diagnosis not present

## 2015-08-14 DIAGNOSIS — S3992XA Unspecified injury of lower back, initial encounter: Secondary | ICD-10-CM | POA: Diagnosis present

## 2015-08-14 MED ORDER — HYDROCODONE-ACETAMINOPHEN 5-325 MG PO TABS
1.0000 | ORAL_TABLET | Freq: Once | ORAL | Status: AC
  Administered 2015-08-14: 1 via ORAL
  Filled 2015-08-14: qty 1

## 2015-08-14 MED ORDER — HYDROCODONE-ACETAMINOPHEN 5-325 MG PO TABS: ORAL_TABLET | ORAL | Status: DC

## 2015-08-14 NOTE — Discharge Instructions (Signed)
Take vicodin for breakthrough pain, do not drink alcohol, drive, care for children or do other critical tasks while taking vicodin.  Please follow with your primary care doctor in the next 2 days for a check-up. They must obtain records for further management.   Do not hesitate to return to the Emergency Department for any new, worsening or concerning symptoms.    Lumbosacral Strain Lumbosacral strain is a strain of any of the parts that make up your lumbosacral vertebrae. Your lumbosacral vertebrae are the bones that make up the lower third of your backbone. Your lumbosacral vertebrae are held together by muscles and tough, fibrous tissue (ligaments).  CAUSES  A sudden blow to your back can cause lumbosacral strain. Also, anything that causes an excessive stretch of the muscles in the low back can cause this strain. This is typically seen when people exert themselves strenuously, fall, lift heavy objects, bend, or crouch repeatedly. RISK FACTORS  Physically demanding work.  Participation in pushing or pulling sports or sports that require a sudden twist of the back (tennis, golf, baseball).  Weight lifting.  Excessive lower back curvature.  Forward-tilted pelvis.  Weak back or abdominal muscles or both.  Tight hamstrings. SIGNS AND SYMPTOMS  Lumbosacral strain may cause pain in the area of your injury or pain that moves (radiates) down your leg.  DIAGNOSIS Your health care provider can often diagnose lumbosacral strain through a physical exam. In some cases, you may need tests such as X-ray exams.  TREATMENT  Treatment for your lower back injury depends on many factors that your clinician will have to evaluate. However, most treatment will include the use of anti-inflammatory medicines. HOME CARE INSTRUCTIONS   Avoid hard physical activities (tennis, racquetball, waterskiing) if you are not in proper physical condition for it. This may aggravate or create problems.  If you have a  back problem, avoid sports requiring sudden body movements. Swimming and walking are generally safer activities.  Maintain good posture.  Maintain a healthy weight.  For acute conditions, you may put ice on the injured area.  Put ice in a plastic bag.  Place a towel between your skin and the bag.  Leave the ice on for 20 minutes, 2-3 times a day.  When the low back starts healing, stretching and strengthening exercises may be recommended. SEEK MEDICAL CARE IF:  Your back pain is getting worse.  You experience severe back pain not relieved with medicines. SEEK IMMEDIATE MEDICAL CARE IF:   You have numbness, tingling, weakness, or problems with the use of your arms or legs.  There is a change in bowel or bladder control.  You have increasing pain in any area of the body, including your belly (abdomen).  You notice shortness of breath, dizziness, or feel faint.  You feel sick to your stomach (nauseous), are throwing up (vomiting), or become sweaty.  You notice discoloration of your toes or legs, or your feet get very cold. MAKE SURE YOU:   Understand these instructions.  Will watch your condition.  Will get help right away if you are not doing well or get worse.   This information is not intended to replace advice given to you by your health care provider. Make sure you discuss any questions you have with your health care provider.   Document Released: 03/31/2005 Document Revised: 07/12/2014 Document Reviewed: 02/07/2013 Elsevier Interactive Patient Education [employer]
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
## 2016-07-09 ENCOUNTER — Encounter (HOSPITAL_COMMUNITY): Payer: Self-pay

## 2016-07-09 ENCOUNTER — Emergency Department (HOSPITAL_COMMUNITY)
Admission: EM | Admit: 2016-07-09 | Discharge: 2016-07-09 | Disposition: A | Discharge status: Home / Self Care | Payer: Medicaid Other | Attending: Emergency Medicine | Admitting: Emergency Medicine

## 2016-07-09 DIAGNOSIS — Y999 Unspecified external cause status: Secondary | ICD-10-CM | POA: Insufficient documentation

## 2016-07-09 DIAGNOSIS — M545 Low back pain, unspecified: Secondary | ICD-10-CM

## 2016-07-09 DIAGNOSIS — Y92410 Unspecified street and highway as the place of occurrence of the external cause: Secondary | ICD-10-CM | POA: Insufficient documentation

## 2016-07-09 DIAGNOSIS — F17210 Nicotine dependence, cigarettes, uncomplicated: Secondary | ICD-10-CM | POA: Insufficient documentation

## 2016-07-09 DIAGNOSIS — Y9389 Activity, other specified: Secondary | ICD-10-CM | POA: Insufficient documentation

## 2016-07-09 DIAGNOSIS — S3992XA Unspecified injury of lower back, initial encounter: Secondary | ICD-10-CM | POA: Insufficient documentation

## 2016-07-09 MED ORDER — METHOCARBAMOL 500 MG PO TABS: 500.0000 mg | ORAL_TABLET | Freq: Two times a day (BID) | ORAL | 0 refills | Status: DC | PRN

## 2016-07-09 MED ORDER — NAPROXEN 250 MG PO TABS
250.0000 mg | ORAL_TABLET | Freq: Two times a day (BID) | ORAL | 0 refills | Status: DC
Start: 2016-07-09 — End: 2016-08-16
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
## 2016-08-16 ENCOUNTER — Encounter (HOSPITAL_COMMUNITY): Payer: Self-pay | Admitting: Family Medicine

## 2016-08-16 ENCOUNTER — Emergency Department (HOSPITAL_COMMUNITY)
Admission: EM | Admit: 2016-08-16 | Discharge: 2016-08-16 | Disposition: A | Discharge status: Home / Self Care | Payer: Medicaid Other | Attending: Emergency Medicine | Admitting: Emergency Medicine

## 2016-08-16 DIAGNOSIS — F17210 Nicotine dependence, cigarettes, uncomplicated: Secondary | ICD-10-CM | POA: Diagnosis not present

## 2016-08-16 DIAGNOSIS — J029 Acute pharyngitis, unspecified: Secondary | ICD-10-CM | POA: Insufficient documentation

## 2016-08-16 LAB — RAPID STREP SCREEN (MED CTR MEBANE ONLY): Streptococcus, Group A Screen (Direct): NEGATIVE

## 2016-08-16 MED ORDER — ACETAMINOPHEN 325 MG PO TABS
650.0000 mg | ORAL_TABLET | Freq: Once | ORAL | Status: AC
  Administered 2016-08-16: 650 mg via ORAL
  Filled 2016-08-16: qty 2

## 2016-08-16 MED ORDER — BENZOCAINE-MENTHOL 6-10 MG MT LOZG: 1.0000 | LOZENGE | OROMUCOSAL | 0 refills | Status: DC | PRN

## 2016-08-16 NOTE — ED Triage Notes (Signed)
Pt presents from home via POV with c/o sore throat, fever/chills that began yesterday. Denies cough/SOB. Took Tylenol at 1730 today and is afebrile in triage - temp at home was 100.4 Axillary at 1600. Pt is A&O, ambulatory in triage and in NAD.

## 2016-08-16 NOTE — Discharge Instructions (Signed)
Your strep screen was negative. Take tylenol and ibuprofen as needed for pain. Use the Chloraseptic for pain. Use a cool mist vaporizer. Follow up with your doctor or return here for worsening symptoms.
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
## 2016-08-16 NOTE — ED Notes (Signed)
See EDP assessment 

## 2016-08-19 ENCOUNTER — Ambulatory Visit (HOSPITAL_COMMUNITY)
Admission: EM | Admit: 2016-08-19 | Discharge: 2016-08-19 | Disposition: A | Discharge status: Home / Self Care | Payer: Medicaid Other | Attending: Internal Medicine | Admitting: Internal Medicine

## 2016-08-19 DIAGNOSIS — R69 Illness, unspecified: Secondary | ICD-10-CM

## 2016-08-19 DIAGNOSIS — J111 Influenza due to unidentified influenza virus with other respiratory manifestations: Secondary | ICD-10-CM

## 2016-08-19 LAB — CULTURE, GROUP A STREP (THRC)

## 2016-08-19 MED ORDER — BENZONATATE 100 MG PO CAPS: 100.0000 mg | ORAL_CAPSULE | Freq: Three times a day (TID) | ORAL | 0 refills | Status: DC

## 2016-08-19 MED ORDER — PREDNISONE 20 MG PO TABS: 20.0000 mg | ORAL_TABLET | Freq: Two times a day (BID) | ORAL | 0 refills | Status: DC

## 2016-08-19 MED ORDER — ALBUTEROL SULFATE HFA 108 (90 BASE) MCG/ACT IN AERS
1.0000 | INHALATION_SPRAY | Freq: Four times a day (QID) | RESPIRATORY_TRACT | 0 refills | Status: DC | PRN
Start: 2016-08-19 — End: 2021-11-02
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
## 2016-08-19 NOTE — Discharge Instructions (Signed)
You most likely have the flu or an influenza-like illness however at this point too much time has passed for the treatment, Tamiflu, to be of any use. I advise rest, plenty of fluids and management of symptoms with over the counter medicines. For symptoms you may take Tylenol as needed every 4-6 hours for body aches or fever, not to exceed 4,000 mg a day, Take mucinex or mucinex DM ever 12 hours with a full glass of water, you may use an inhaled steroid such as Flonase, 2 sprays each nostril once a day for congestion, or an antihistamine such as Claritin or Zyrtec once a day. For cough, I have prescribed a medication called Tessalon. Take 1 tablet every 8 hours as needed for your cough. For wheezing and shortness of breath, I have prescribed albuterol inhaler, take 1-2 puffs every 4-6 hours as needed for wheezing and shortness of breath. I have also prescribed steroid, prednisone take one tablet twice a day for 6 days. Should your symptoms worsen or fail to resolve, follow up with your primary care provider or return to clinic.

## 2016-08-19 NOTE — ED Triage Notes (Signed)
C/o flu like since Tuesday States on Monday she went to the er for a sore throat States new sx started on Tuesday States she has cough which is a little productive States cough makes it hard to breathe States she has fever and headache  otc meds used as tx

## 2016-09-16 ENCOUNTER — Ambulatory Visit (HOSPITAL_COMMUNITY)
Admission: EM | Admit: 2016-09-16 | Discharge: 2016-09-16 | Disposition: A | Discharge status: Home / Self Care | Payer: Medicaid Other | Attending: Family Medicine | Admitting: Family Medicine

## 2016-09-16 ENCOUNTER — Encounter (HOSPITAL_COMMUNITY): Payer: Self-pay | Admitting: Emergency Medicine

## 2016-09-16 DIAGNOSIS — S39012A Strain of muscle, fascia and tendon of lower back, initial encounter: Secondary | ICD-10-CM

## 2016-09-16 DIAGNOSIS — M542 Cervicalgia: Secondary | ICD-10-CM

## 2016-09-16 MED ORDER — CYCLOBENZAPRINE HCL 10 MG PO TABS: 10.0000 mg | ORAL_TABLET | Freq: Two times a day (BID) | ORAL | 0 refills | Status: DC | PRN

## 2016-09-16 MED ORDER — NAPROXEN 500 MG PO TABS: 500.0000 mg | ORAL_TABLET | Freq: Two times a day (BID) | ORAL | 0 refills | Status: DC
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
## 2017-07-06 ENCOUNTER — Other Ambulatory Visit: Payer: Self-pay

## 2017-07-06 ENCOUNTER — Encounter: Payer: Self-pay | Admitting: Emergency Medicine

## 2017-07-06 ENCOUNTER — Emergency Department
Admission: EM | Admit: 2017-07-06 | Discharge: 2017-07-06 | Disposition: A | Discharge status: Home / Self Care | Payer: Medicaid Other | Attending: Emergency Medicine | Admitting: Emergency Medicine

## 2017-07-06 DIAGNOSIS — Y999 Unspecified external cause status: Secondary | ICD-10-CM | POA: Insufficient documentation

## 2017-07-06 DIAGNOSIS — S39012A Strain of muscle, fascia and tendon of lower back, initial encounter: Secondary | ICD-10-CM | POA: Diagnosis not present

## 2017-07-06 DIAGNOSIS — Z791 Long term (current) use of non-steroidal anti-inflammatories (NSAID): Secondary | ICD-10-CM | POA: Diagnosis not present

## 2017-07-06 DIAGNOSIS — Y92410 Unspecified street and highway as the place of occurrence of the external cause: Secondary | ICD-10-CM | POA: Insufficient documentation

## 2017-07-06 DIAGNOSIS — Y939 Activity, unspecified: Secondary | ICD-10-CM | POA: Diagnosis not present

## 2017-07-06 DIAGNOSIS — S3992XA Unspecified injury of lower back, initial encounter: Secondary | ICD-10-CM | POA: Diagnosis present

## 2017-07-06 DIAGNOSIS — F17210 Nicotine dependence, cigarettes, uncomplicated: Secondary | ICD-10-CM | POA: Diagnosis not present

## 2017-07-06 MED ORDER — TRAMADOL HCL 50 MG PO TABS: 50.0000 mg | ORAL_TABLET | Freq: Four times a day (QID) | ORAL | 0 refills | Status: AC | PRN

## 2017-07-06 MED ORDER — IBUPROFEN 600 MG PO TABS: 600.0000 mg | ORAL_TABLET | Freq: Three times a day (TID) | ORAL | 0 refills | Status: DC | PRN

## 2017-07-06 MED ORDER — CYCLOBENZAPRINE HCL 10 MG PO TABS: 10.0000 mg | ORAL_TABLET | Freq: Three times a day (TID) | ORAL | 0 refills | Status: DC | PRN
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
## 2017-12-13 DIAGNOSIS — L723 Sebaceous cyst: Secondary | ICD-10-CM | POA: Insufficient documentation

## 2018-08-02 ENCOUNTER — Emergency Department
Admission: EM | Admit: 2018-08-02 | Discharge: 2018-08-02 | Disposition: A | Discharge status: Home / Self Care | Payer: Medicaid Other | Attending: Emergency Medicine | Admitting: Emergency Medicine

## 2018-08-02 ENCOUNTER — Other Ambulatory Visit: Payer: Self-pay

## 2018-08-02 DIAGNOSIS — R112 Nausea with vomiting, unspecified: Secondary | ICD-10-CM

## 2018-08-02 DIAGNOSIS — T50905A Adverse effect of unspecified drugs, medicaments and biological substances, initial encounter: Secondary | ICD-10-CM | POA: Diagnosis not present

## 2018-08-02 DIAGNOSIS — Y69 Unspecified misadventure during surgical and medical care: Secondary | ICD-10-CM | POA: Diagnosis not present

## 2018-08-02 DIAGNOSIS — E876 Hypokalemia: Secondary | ICD-10-CM | POA: Diagnosis not present

## 2018-08-02 DIAGNOSIS — T887XXA Unspecified adverse effect of drug or medicament, initial encounter: Secondary | ICD-10-CM | POA: Diagnosis not present

## 2018-08-02 DIAGNOSIS — R1110 Vomiting, unspecified: Secondary | ICD-10-CM | POA: Diagnosis present

## 2018-08-02 DIAGNOSIS — B349 Viral infection, unspecified: Secondary | ICD-10-CM

## 2018-08-02 DIAGNOSIS — Z79899 Other long term (current) drug therapy: Secondary | ICD-10-CM | POA: Insufficient documentation

## 2018-08-02 DIAGNOSIS — F17210 Nicotine dependence, cigarettes, uncomplicated: Secondary | ICD-10-CM | POA: Diagnosis not present

## 2018-08-02 DIAGNOSIS — R197 Diarrhea, unspecified: Secondary | ICD-10-CM

## 2018-08-02 LAB — COMPREHENSIVE METABOLIC PANEL
ALT: 12 U/L (ref 0–44)
AST: 22 U/L (ref 15–41)
Albumin: 4.3 g/dL (ref 3.5–5.0)
Alkaline Phosphatase: 77 U/L (ref 38–126)
Anion gap: 14 (ref 5–15)
BUN: 10 mg/dL (ref 6–20)
CO2: 19 mmol/L — ABNORMAL LOW (ref 22–32)
Calcium: 9 mg/dL (ref 8.9–10.3)
Chloride: 101 mmol/L (ref 98–111)
Creatinine, Ser: 0.69 mg/dL (ref 0.44–1.00)
GFR calc Af Amer: >60 mL/min (ref >60)
GFR calc non Af Amer: >60 mL/min (ref >60)
Glucose, Bld: 120 mg/dL — ABNORMAL HIGH (ref 70–99)
Potassium: 2.8 mmol/L — ABNORMAL LOW (ref 3.5–5.1)
Sodium: 134 mmol/L — ABNORMAL LOW (ref 135–145)
Total Bilirubin: 0.6 mg/dL (ref 0.3–1.2)
Total Protein: 8.3 g/dL — ABNORMAL HIGH (ref 6.5–8.1)

## 2018-08-02 LAB — CBC
HCT: 47.1 % — ABNORMAL HIGH (ref 36.0–46.0)
Hemoglobin: 15.4 g/dL — ABNORMAL HIGH (ref 12.0–15.0)
MCH: 27.8 pg (ref 26.0–34.0)
MCHC: 32.7 g/dL (ref 30.0–36.0)
MCV: 85.2 fL (ref 80.0–100.0)
Platelets: 236 10*3/uL (ref 150–400)
RBC: 5.53 MIL/uL — ABNORMAL HIGH (ref 3.87–5.11)
RDW: 13.9 % (ref 11.5–15.5)
WBC: 9.5 10*3/uL (ref 4.0–10.5)
nRBC: 0 % (ref 0.0–0.2)

## 2018-08-02 MED ORDER — POTASSIUM CHLORIDE CRYS ER 20 MEQ PO TBCR
40.0000 meq | EXTENDED_RELEASE_TABLET | Freq: Once | ORAL | Status: AC
  Administered 2018-08-02: 40 meq via ORAL
  Filled 2018-08-02: qty 2

## 2018-08-02 MED ORDER — SODIUM CHLORIDE 0.9 % IV BOLUS
1000.0000 mL | Freq: Once | INTRAVENOUS | Status: AC
  Administered 2018-08-02: 1000 mL via INTRAVENOUS

## 2018-08-02 MED ORDER — ONDANSETRON HCL 4 MG/2ML IJ SOLN
4.0000 mg | INTRAMUSCULAR | Status: AC
  Administered 2018-08-02: 4 mg via INTRAVENOUS
  Filled 2018-08-02: qty 2

## 2018-08-02 NOTE — ED Notes (Signed)
Pt sitting in lobby making loud wretching noises, spitting in emesis bag and clearing throat with no emesis noted

## 2018-08-02 NOTE — ED Triage Notes (Signed)
Pt was dx with flu Monday and started vomiting today, states x 3. Pt concerned about dehydration.

## 2018-08-02 NOTE — Discharge Instructions (Addendum)
As we discussed, I believe that the Tamiflu you are taking for your influenza infection was causing the nausea, vomiting, and diarrhea.  I recommend you stop taking the Tamiflu and try to stay hydrated.  Use over-the-counter ibuprofen and Tylenol as needed for pain and fever.  Follow-up with your regular doctor in a few days.  Return to the emergency department if you develop new or worsening symptoms that concern you.
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
## 2019-10-25 ENCOUNTER — Ambulatory Visit: Payer: Medicaid Other | Attending: Internal Medicine

## 2019-10-25 DIAGNOSIS — Z23 Encounter for immunization: Secondary | ICD-10-CM

## 2019-10-25 NOTE — Progress Notes (Signed)
   Covid-19 Vaccination Clinic  Name:  Carol Rice    MRN: [id number] DOB: 03/28/1976  10/25/2019  Carol Rice was observed post Covid-19 immunization for 15 minutes without incident. She was provided with Vaccine Information Sheet and instruction to access the V-Safe system.   Carol Rice was instructed to call 911 with any severe reactions post vaccine: Marland Kitchen Difficulty breathing  . Swelling of face and throat  . A fast heartbeat  . A bad rash all over body  . Dizziness and weakness   Immunizations Administered    Name Date Dose VIS Date Route   Pfizer COVID-19 Vaccine 10/25/2019 12:07 PM 0.3 mL 08/29/2018 Intramuscular   Manufacturer: [employer], [employer]   Lot: [id number]   NDC: [address]-1

## 2019-11-20 ENCOUNTER — Ambulatory Visit: Payer: Medicaid Other

## 2019-11-21 ENCOUNTER — Ambulatory Visit: Payer: Medicaid Other

## 2019-11-23 ENCOUNTER — Ambulatory Visit: Payer: Medicaid Other | Attending: Internal Medicine

## 2019-11-23 DIAGNOSIS — Z23 Encounter for immunization: Secondary | ICD-10-CM

## 2019-11-23 NOTE — Progress Notes (Signed)
   Covid-19 Vaccination Clinic  Name:  DENIS KOPPEL    MRN: [id number] DOB: 1976-05-20  11/23/2019  Ms. Lehr was observed post Covid-19 immunization for 15 minutes without incident. She was provided with Vaccine Information Sheet and instruction to access the V-Safe system.   Ms. Tieszen was instructed to call 911 with any severe reactions post vaccine: Marland Kitchen Difficulty breathing  . Swelling of face and throat  . A fast heartbeat  . A bad rash all over body  . Dizziness and weakness   Immunizations Administered    Name Date Dose VIS Date Route   Pfizer COVID-19 Vaccine 11/23/2019 11:35 AM 0.3 mL 08/29/2018 Intramuscular   Manufacturer: [employer], [employer]   Lot: [id number]   NDC: [address]-2

## 2021-04-15 ENCOUNTER — Ambulatory Visit
Admission: EM | Admit: 2021-04-15 | Discharge: 2021-04-15 | Disposition: A | Discharge status: Home / Self Care | Payer: Medicaid Other | Attending: Emergency Medicine | Admitting: Emergency Medicine

## 2021-04-15 DIAGNOSIS — L02411 Cutaneous abscess of right axilla: Secondary | ICD-10-CM | POA: Diagnosis not present

## 2021-04-15 MED ORDER — SULFAMETHOXAZOLE-TRIMETHOPRIM 800-160 MG PO TABS: 1.0000 | ORAL_TABLET | Freq: Two times a day (BID) | ORAL | 0 refills | Status: AC

## 2021-04-15 NOTE — ED Triage Notes (Signed)
Patient presents to Urgent Care with complaints of right axillary abscess x 1.5 months ago. Treating with ibuprofen, cold/warm compresses.    Denies fever
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
## 2021-04-15 NOTE — Discharge Instructions (Addendum)
Take the antibiotic as directed.  Encourage drainage from the abscess.  Continue warm compresses as discussed.  Schedule an appointment with a dermatologist soon as possible.

## 2021-05-05 ENCOUNTER — Telehealth: Payer: Medicaid Other | Admitting: Physician Assistant

## 2021-05-05 DIAGNOSIS — N12 Tubulo-interstitial nephritis, not specified as acute or chronic: Secondary | ICD-10-CM

## 2021-05-05 MED ORDER — SULFAMETHOXAZOLE-TRIMETHOPRIM 800-160 MG PO TABS: 1.0000 | ORAL_TABLET | Freq: Two times a day (BID) | ORAL | 0 refills | Status: DC

## 2021-05-05 NOTE — Patient Instructions (Signed)
Carol Rice, thank you for joining Margaretann Loveless, PA-C for today's virtual visit.  While this provider is not your primary care provider (PCP), if your PCP is located in our provider database this encounter information will be shared with them immediately following your visit.  Consent: (Patient) Carol Rice provided verbal consent for this virtual visit at the beginning of the encounter.  Current Medications:  Current Outpatient Medications:    sulfamethoxazole-trimethoprim (BACTRIM DS) 800-160 MG tablet, Take 1 tablet by mouth 2 (two) times daily., Disp: 20 tablet, Rfl: 0   albuterol (PROVENTIL HFA;VENTOLIN HFA) 108 (90 Base) MCG/ACT inhaler, Inhale 1-2 puffs into the lungs every 6 (six) hours as needed for wheezing or shortness of breath., Disp: 1 Inhaler, Rfl: 0   benzocaine-menthol (CHLORASEPTIC) 6-10 MG lozenge, Take 1 lozenge by mouth as needed for sore throat., Disp: 100 tablet, Rfl: 0   benzonatate (TESSALON) 100 MG capsule, Take 1 capsule (100 mg total) by mouth every 8 (eight) hours. (Patient not taking: No sig reported), Disp: 21 capsule, Rfl: 0   cyclobenzaprine (FLEXERIL) 10 MG tablet, Take 1 tablet (10 mg total) by mouth 2 (two) times daily as needed for muscle spasms., Disp: 20 tablet, Rfl: 0   cyclobenzaprine (FLEXERIL) 10 MG tablet, Take 1 tablet (10 mg total) by mouth 3 (three) times daily as needed., Disp: 15 tablet, Rfl: 0   HYDROcodone-acetaminophen (NORCO/VICODIN) 5-325 MG tablet, Take 1-2 tablets by mouth every 6 hours as needed for pain and/or cough. (Patient not taking: No sig reported), Disp: 7 tablet, Rfl: 0   ibuprofen (ADVIL,MOTRIN) 600 MG tablet, Take 1 tablet (600 mg total) by mouth every 8 (eight) hours as needed., Disp: 15 tablet, Rfl: 0   methocarbamol (ROBAXIN) 500 MG tablet, Take 1 tablet (500 mg total) by mouth 2 (two) times daily as needed for muscle spasms. (Patient not taking: No sig reported), Disp: 20 tablet, Rfl: 0   naproxen  (NAPROSYN) 500 MG tablet, Take 1 tablet (500 mg total) by mouth 2 (two) times daily with a meal., Disp: 20 tablet, Rfl: 0   predniSONE (DELTASONE) 20 MG tablet, Take 1 tablet (20 mg total) by mouth 2 (two) times daily with a meal. (Patient not taking: No sig reported), Disp: 12 tablet, Rfl: 0   Medications ordered in this encounter:  Meds ordered this encounter  Medications   sulfamethoxazole-trimethoprim (BACTRIM DS) 800-160 MG tablet    Sig: Take 1 tablet by mouth 2 (two) times daily.    Dispense:  20 tablet    Refill:  0    Order Specific Question:   Supervising Provider    Answer:   Hyacinth Meeker, BRIAN [[id number]]     *If you need refills on other medications prior to your next appointment, please contact your pharmacy*  Follow-Up: Call back or seek an in-person evaluation if the symptoms worsen or if the condition fails to improve as anticipated.  Other Instructions Pyelonephritis, Adult Pyelonephritis is an infection that occurs in the kidney. The kidneys are organs that help clean the blood by moving waste out of the blood and into the pee (urine). This infection can happen quickly, or it can last for a long time. In most cases, it clears up with treatment and does not cause other problems. What are the causes? This condition may be caused by: Germs (bacteria) going from the bladder up to the kidney. This may happen after having a bladder infection. Germs going from the blood to the kidney. What increases  the risk? This condition is more likely to develop in: Pregnant women. Older people. People who have any of these conditions: Diabetes. Inflammation of the prostate gland (prostatitis), in males. Kidney stones or bladder stones. Other problems with the kidney or the parts of your body that carry pee from the kidneys to the bladder (ureters). Cancer. People who have a small, thin tube (catheter) placed in the bladder. People who are sexually active. Women who use a medicine that  kills sperm (spermicide) to prevent pregnancy. People who have had a prior urinary tract infection (UTI). What are the signs or symptoms? Symptoms of this condition include: Peeing often. A strong urge to pee right away. Burning or stinging when peeing. Belly pain. Back pain. Pain in the side (flank area). Fever or chills. Blood in the pee, or dark pee. Feeling sick to your stomach (nauseous) or throwing up (vomiting). How is this treated? This condition may be treated by: Taking antibiotic medicines by mouth (orally). Drinking enough fluids. If the infection is bad, you may need to stay in the hospital. You may be given antibiotics and fluids that are put directly into a vein through an IV tube. In some cases, other treatments may be needed. Follow these instructions at home: Medicines Take your antibiotic medicine as told by your doctor. Do not stop taking the antibiotic even if you start to feel better. Take over-the-counter and prescription medicines only as told by your doctor. General instructions  Drink enough fluid to keep your pee pale yellow. Avoid caffeine, tea, and carbonated drinks. Pee (urinate) often. Avoid holding in pee for long periods of time. Pee before and after sex. After pooping (having a bowel movement), women should wipe from front to back. Use each tissue only once. Keep all follow-up visits as told by your doctor. This is important. Contact a doctor if: You do not feel better after 2 days. Your symptoms get worse. You have a fever. Get help right away if: You cannot take your medicine or drink fluids as told. You have chills and shaking. You throw up. You have very bad pain in your side or back. You feel very weak or you pass out (faint). Summary Pyelonephritis is an infection that occurs in the kidney. In most cases, this infection clears up with treatment and does not cause other problems. Take your antibiotic medicine as told by your doctor.  Do not stop taking the antibiotic even if you start to feel better. Drink enough fluid to keep your pee pale yellow. This information is not intended to replace advice given to you by your health care provider. Make sure you discuss any questions you have with your health care provider. Document Revised: 04/25/2018 Document Reviewed: 04/25/2018 Elsevier Patient Education  2022 [employer].    If you have been instructed to have an in-person evaluation today at a local Urgent Care facility, please use the link below. It will take you to a list of all of our available [facility], including address, phone number and hours of operation. Please do not delay care.  [facility]  If you or a family member do not have a primary care provider, use the link below to schedule a visit and establish care. When you choose a [facility] primary care physician or advanced practice provider, you gain a long-term partner in health. Find a Primary Care Provider  Learn more about [facility]'s in-office and virtual care options:  - Get Care  Now

## 2021-05-05 NOTE — Progress Notes (Signed)
Virtual Visit Consent   Carol Rice, you are scheduled for a virtual visit with a [facility] provider today.     Just as with appointments in the office, your consent must be obtained to participate.  Your consent will be active for this visit and any virtual visit you may have with one of our providers in the next 365 days.     If you have a MyChart account, a copy of this consent can be sent to you electronically.  All virtual visits are billed to your insurance company just like a traditional visit in the office.    As this is a virtual visit, video technology does not allow for your provider to perform a traditional examination.  This may limit your provider's ability to fully assess your condition.  If your provider identifies any concerns that need to be evaluated in person or the need to arrange testing (such as labs, EKG, etc.), we will make arrangements to do so.     Although advances in technology are sophisticated, we cannot ensure that it will always work on either your end or our end.  If the connection with a video visit is poor, the visit may have to be switched to a telephone visit.  With either a video or telephone visit, we are not always able to ensure that we have a secure connection.     I need to obtain your verbal consent now.   Are you willing to proceed with your visit today?    MARIELOUISE AMEY has provided verbal consent on 05/05/2021 for a virtual visit (video or telephone).   Margaretann Loveless, PA-C   Date: 05/05/2021 9:03 AM   Virtual Visit via Video Note   I, Margaretann Loveless, connected with  Carol Rice  ([id number], [birth year]) on 05/05/21 at  9:00 AM EDT by a video-enabled telemedicine application and verified that I am speaking with the correct person using two identifiers.  Location: Patient: Virtual Visit Location Patient: Home Provider: Virtual Visit Location Provider: Home Office   I discussed the limitations of evaluation and  management by telemedicine and the availability of in person appointments. The patient expressed understanding and agreed to proceed.    History of Present Illness: Carol Rice is a 45 y.o. who identifies as a female who was assigned female at birth, and is being seen today for possible UTI.  HPI: Urinary Tract Infection  This is a new problem. The current episode started in the past 7 days (saturday evening). The problem has been gradually worsening. The quality of the pain is described as aching and stabbing. The pain is moderate. There has been no fever. There is A history of pyelonephritis. Associated symptoms include chills, flank pain (left lower back and side), hesitancy, nausea and vomiting. Pertinent negatives include no frequency, hematuria or urgency. She has tried NSAIDs and increased fluids (cranberrry juice, heating pad) for the symptoms. The treatment provided no relief. Her past medical history is significant for recurrent UTIs. There is no history of catheterization, kidney stones or a urological procedure. has had pyelonephritis 2 times in the past     Problems: There are no problems to display for this patient.   Allergies: No Known Allergies Medications:  Current Outpatient Medications:    sulfamethoxazole-trimethoprim (BACTRIM DS) 800-160 MG tablet, Take 1 tablet by mouth 2 (two) times daily., Disp: 20 tablet, Rfl: 0   albuterol (PROVENTIL HFA;VENTOLIN HFA) 108 (90 Base) MCG/ACT inhaler, Inhale 1-2  puffs into the lungs every 6 (six) hours as needed for wheezing or shortness of breath., Disp: 1 Inhaler, Rfl: 0   benzocaine-menthol (CHLORASEPTIC) 6-10 MG lozenge, Take 1 lozenge by mouth as needed for sore throat., Disp: 100 tablet, Rfl: 0   benzonatate (TESSALON) 100 MG capsule, Take 1 capsule (100 mg total) by mouth every 8 (eight) hours. (Patient not taking: No sig reported), Disp: 21 capsule, Rfl: 0   cyclobenzaprine (FLEXERIL) 10 MG tablet, Take 1 tablet (10 mg total)  by mouth 2 (two) times daily as needed for muscle spasms., Disp: 20 tablet, Rfl: 0   cyclobenzaprine (FLEXERIL) 10 MG tablet, Take 1 tablet (10 mg total) by mouth 3 (three) times daily as needed., Disp: 15 tablet, Rfl: 0   HYDROcodone-acetaminophen (NORCO/VICODIN) 5-325 MG tablet, Take 1-2 tablets by mouth every 6 hours as needed for pain and/or cough. (Patient not taking: No sig reported), Disp: 7 tablet, Rfl: 0   ibuprofen (ADVIL,MOTRIN) 600 MG tablet, Take 1 tablet (600 mg total) by mouth every 8 (eight) hours as needed., Disp: 15 tablet, Rfl: 0   methocarbamol (ROBAXIN) 500 MG tablet, Take 1 tablet (500 mg total) by mouth 2 (two) times daily as needed for muscle spasms. (Patient not taking: No sig reported), Disp: 20 tablet, Rfl: 0   naproxen (NAPROSYN) 500 MG tablet, Take 1 tablet (500 mg total) by mouth 2 (two) times daily with a meal., Disp: 20 tablet, Rfl: 0   predniSONE (DELTASONE) 20 MG tablet, Take 1 tablet (20 mg total) by mouth 2 (two) times daily with a meal. (Patient not taking: No sig reported), Disp: 12 tablet, Rfl: 0  Observations/Objective: Patient is well-developed, well-nourished in no acute distress.  Resting comfortably at home.  Head is normocephalic, atraumatic.  No labored breathing.  Speech is clear and coherent with logical content.  Patient is alert and oriented at baseline.    Assessment and Plan: 1. Pyelonephritis - sulfamethoxazole-trimethoprim (BACTRIM DS) 800-160 MG tablet; Take 1 tablet by mouth 2 (two) times daily.  Dispense: 20 tablet; Refill: 0  - Worsening symptoms. - Will treat empirically with Bactrim  - Continue to push fluids.  - She is to call or seek in person evaluation if symptoms do not improve or if they worsen.   Follow Up Instructions: I discussed the assessment and treatment plan with the patient. The patient was provided an opportunity to ask questions and all were answered. The patient agreed with the plan and demonstrated an  understanding of the instructions.  A copy of instructions were sent to the patient via MyChart unless otherwise noted below.    The patient was advised to call back or seek an in-person evaluation if the symptoms worsen or if the condition fails to improve as anticipated.  Time:  I spent 10 minutes with the patient via telehealth technology discussing the above problems/concerns.    Mar Daring, PA-C

## 2021-07-13 ENCOUNTER — Other Ambulatory Visit: Payer: Self-pay

## 2021-07-13 ENCOUNTER — Ambulatory Visit
Admission: RE | Admit: 2021-07-13 | Discharge: 2021-07-13 | Payer: Medicaid Other | Source: Ambulatory Visit | Attending: Emergency Medicine | Admitting: Emergency Medicine

## 2021-07-13 VITALS — BP 132/79 | HR 103 | Temp 98.2°F | Resp 18

## 2021-07-13 DIAGNOSIS — R112 Nausea with vomiting, unspecified: Secondary | ICD-10-CM | POA: Diagnosis not present

## 2021-07-13 DIAGNOSIS — R1013 Epigastric pain: Secondary | ICD-10-CM | POA: Diagnosis not present

## 2021-07-13 DIAGNOSIS — R9431 Abnormal electrocardiogram [ECG] [EKG]: Secondary | ICD-10-CM

## 2021-07-13 LAB — POCT URINALYSIS DIP (MANUAL ENTRY)
Bilirubin, UA: NEGATIVE
Glucose, UA: NEGATIVE mg/dL
Ketones, POC UA: NEGATIVE mg/dL
Leukocytes, UA: NEGATIVE
Nitrite, UA: NEGATIVE
Protein Ur, POC: NEGATIVE mg/dL
Spec Grav, UA: 1.015 (ref 1.010–1.025)
Urobilinogen, UA: 0.2 E.U./dL
pH, UA: 6 (ref 5.0–8.0)

## 2021-07-13 LAB — POCT URINE PREGNANCY: Preg Test, Ur: NEGATIVE

## 2021-07-13 NOTE — ED Triage Notes (Signed)
Pt c/o of epigastric pain x 3 days. She feels as of she has heart burn and her stomach feels like its growling.

## 2021-07-13 NOTE — Discharge Instructions (Addendum)
Go to the emergency department for evaluation of your abdominal pain, nausea with vomiting, and abnormal EKG.
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
## 2021-08-13 ENCOUNTER — Other Ambulatory Visit: Payer: Self-pay

## 2021-08-13 ENCOUNTER — Ambulatory Visit (LOCAL_COMMUNITY_HEALTH_CENTER): Payer: Medicaid Other

## 2021-08-13 DIAGNOSIS — Z23 Encounter for immunization: Secondary | ICD-10-CM | POA: Diagnosis not present

## 2021-08-13 DIAGNOSIS — Z719 Counseling, unspecified: Secondary | ICD-10-CM

## 2021-08-13 NOTE — Progress Notes (Signed)
Pt in clinic requesting Tdap and MMR required for school. Given 2 vaccines as requested and tolerated well. Copies of upddated NCIR given to pt. M.Carr Shartzer,LPN

## 2021-09-14 ENCOUNTER — Ambulatory Visit: Admit: 2021-09-14 | Discharge status: Absent without leave | Payer: Medicaid Other

## 2021-09-15 ENCOUNTER — Telehealth: Payer: Medicaid Other | Admitting: Family Medicine

## 2021-09-15 ENCOUNTER — Emergency Department (HOSPITAL_BASED_OUTPATIENT_CLINIC_OR_DEPARTMENT_OTHER): Payer: Medicaid Other

## 2021-09-15 ENCOUNTER — Encounter (HOSPITAL_BASED_OUTPATIENT_CLINIC_OR_DEPARTMENT_OTHER): Payer: Self-pay

## 2021-09-15 ENCOUNTER — Other Ambulatory Visit: Payer: Self-pay

## 2021-09-15 ENCOUNTER — Ambulatory Visit
Admission: EM | Admit: 2021-09-15 | Discharge: 2021-09-15 | Disposition: A | Discharge status: Home / Self Care | Payer: Medicaid Other

## 2021-09-15 ENCOUNTER — Emergency Department (HOSPITAL_BASED_OUTPATIENT_CLINIC_OR_DEPARTMENT_OTHER)
Admission: EM | Admit: 2021-09-15 | Discharge: 2021-09-15 | Disposition: A | Discharge status: Home / Self Care | Payer: Medicaid Other | Attending: Emergency Medicine | Admitting: Emergency Medicine

## 2021-09-15 DIAGNOSIS — K644 Residual hemorrhoidal skin tags: Secondary | ICD-10-CM | POA: Insufficient documentation

## 2021-09-15 DIAGNOSIS — N760 Acute vaginitis: Secondary | ICD-10-CM | POA: Insufficient documentation

## 2021-09-15 DIAGNOSIS — K921 Melena: Secondary | ICD-10-CM

## 2021-09-15 DIAGNOSIS — R1030 Lower abdominal pain, unspecified: Secondary | ICD-10-CM | POA: Diagnosis present

## 2021-09-15 DIAGNOSIS — B9689 Other specified bacterial agents as the cause of diseases classified elsewhere: Secondary | ICD-10-CM

## 2021-09-15 DIAGNOSIS — R109 Unspecified abdominal pain: Secondary | ICD-10-CM

## 2021-09-15 DIAGNOSIS — K625 Hemorrhage of anus and rectum: Secondary | ICD-10-CM

## 2021-09-15 LAB — COMPREHENSIVE METABOLIC PANEL
ALT: 8 U/L (ref 0–44)
AST: 17 U/L (ref 15–41)
Albumin: 4.5 g/dL (ref 3.5–5.0)
Alkaline Phosphatase: 91 U/L (ref 38–126)
Anion gap: 9 (ref 5–15)
BUN: 8 mg/dL (ref 6–20)
CO2: 25 mmol/L (ref 22–32)
Calcium: 9.7 mg/dL (ref 8.9–10.3)
Chloride: 102 mmol/L (ref 98–111)
Creatinine, Ser: 0.65 mg/dL (ref 0.44–1.00)
GFR, Estimated: >60 mL/min (ref >60)
Glucose, Bld: 118 mg/dL — ABNORMAL HIGH (ref 70–99)
Potassium: 4 mmol/L (ref 3.5–5.1)
Sodium: 136 mmol/L (ref 135–145)
Total Bilirubin: 0.6 mg/dL (ref 0.3–1.2)
Total Protein: 7.6 g/dL (ref 6.5–8.1)

## 2021-09-15 LAB — WET PREP, GENITAL
Sperm: NONE SEEN
Trich, Wet Prep: NONE SEEN
WBC, Wet Prep HPF POC: <10 (ref <10)

## 2021-09-15 LAB — URINALYSIS, ROUTINE W REFLEX MICROSCOPIC
Bilirubin Urine: NEGATIVE
Glucose, UA: NEGATIVE mg/dL
Leukocytes,Ua: NEGATIVE
Nitrite: NEGATIVE
Specific Gravity, Urine: 1.023 (ref 1.005–1.030)
pH: 5.5 (ref 5.0–8.0)

## 2021-09-15 LAB — CBC
HCT: 42.9 % (ref 36.0–46.0)
Hemoglobin: 14.2 g/dL (ref 12.0–15.0)
MCH: 28.3 pg (ref 26.0–34.0)
MCHC: 33.1 g/dL (ref 30.0–36.0)
MCV: 85.6 fL (ref 80.0–100.0)
Platelets: 297 10*3/uL (ref 150–400)
RBC: 5.01 MIL/uL (ref 3.87–5.11)
RDW: 14.4 % (ref 11.5–15.5)
WBC: 13.7 10*3/uL — ABNORMAL HIGH (ref 4.0–10.5)
nRBC: 0 % (ref 0.0–0.2)

## 2021-09-15 LAB — OCCULT BLOOD X 1 CARD TO LAB, STOOL: Fecal Occult Bld: NEGATIVE

## 2021-09-15 LAB — PREGNANCY, URINE: Preg Test, Ur: NEGATIVE

## 2021-09-15 LAB — LIPASE, BLOOD: Lipase: 45 U/L (ref 11–51)

## 2021-09-15 MED ORDER — METRONIDAZOLE 500 MG PO TABS: 500.0000 mg | ORAL_TABLET | Freq: Two times a day (BID) | ORAL | 0 refills | Status: AC

## 2021-09-15 MED ORDER — ONDANSETRON HCL 4 MG PO TABS: 4.0000 mg | ORAL_TABLET | Freq: Four times a day (QID) | ORAL | 0 refills | Status: DC

## 2021-09-15 MED ORDER — IOHEXOL 300 MG/ML  SOLN
100.0000 mL | Freq: Once | INTRAMUSCULAR | Status: AC | PRN
  Administered 2021-09-15: 80 mL via INTRAVENOUS

## 2021-09-15 NOTE — Discharge Instructions (Addendum)
You are seen in the emergency department today for abdominal pain and bleeding from your bottom.  Your lab work and your CT scan were reassuring.  You do have a condition called bacterial vaginosis which is not an STD.  It does require antibiotics.  I am prescribing you metronidazole.  You have been prescribed an antibiotic called metronidazole, also known as Flagyl. Some side effects of metronidazole you should be aware of are nausea and headache. Take the medication with food to minimize stomach upset. It is imperative that you do not drink alcohol while taking this antibiotic. Please take the medication as prescribed. Please take the medication until your antibiotic course is complete.  Please take this twice a day over the next 7 days.  Please return to the emergency department for worsening of your rectal bleeding or worsening abdominal pain with fever. ?

## 2021-09-15 NOTE — ED Provider Notes (Signed)
Pt presents with complaints of severe lower abdominal pain that comes and goes, last episode was 3 AM this morning followed with an urge to move her bowels but was unable to do so.  Patient states about an hour later she attempted to move her bowels again and there was bright red blood in the toilet.  Pt endorses a history of hemorrhoids but that this bleeding is significantly more. Pt also reports history of  bouts of severe pain in her abdomen causing her to sweat in the past, states these symptoms are similar, patient states last episode was about a month ago, does not recall diagnosis.  Patient advised that imaging is warranted given bright red blood per rectum.  Patient redirected to the emergency room for further evaluation.  Vital signs are stable on arrival, patient is mentating well, patient deemed stable for transport via personal vehicle. ?  ?Theadora Rama Scales, PA-C ?09/15/21 1102 ? ?

## 2021-09-15 NOTE — Progress Notes (Signed)
[place] ? ?Needs in person assessment of perineum/anal area due to blood in stool and the feeling of something blocking. ? ?Patient acknowledged agreement and understanding of the plan.  ?  ?

## 2021-09-15 NOTE — ED Triage Notes (Signed)
Pt presents with complaints of severe lower abdominal pain that comes and goes. Pt also endorses having bright red blood in her stool. Symptoms started yesterday. Pt does have hemorrhoids but that this blood seems different. Pt reports bouts of severe pain in her abdomen causing her to sweat. Reports bowel movement this am.  ?

## 2021-09-15 NOTE — ED Triage Notes (Signed)
Patient here POV from Home with ABD Pain. ? ?Patient states she has had ABD for approximately 36 hours. Pain is Lower ABD Bilaterally and is intermittent in nature. Pain is Dull/Ache Like. ? ?Moderate Nausea. 1 Episode of Emesis yesterday. No Constipation. Mild Diarrhea with Blood noted to Same. No Fevers. No Urinary Symptoms.  ? ?Sent by UC for Evaluation and Possible Imaging.  ? ?NAD noted during Triage. A&Ox4. GCS 15. Ambulatory.  ?

## 2021-09-15 NOTE — ED Provider Notes (Signed)
?[facility] EMERGENCY DEPT ?Provider Note ? ? ?CSN: [id number] ?Arrival date & time: 09/15/21  1402 ? ?  ? ?History ? ?Chief Complaint  ?Patient presents with  ? Abdominal Pain  ? ? ?Carol Rice is a 46 y.o. female.  With past medical history of anxiety who presents to the emergency department with abdominal pain. ? ?Patient states that at 3 AM yesterday morning she woke up with lower abdominal pain.  She states that she had the urge to go to the restroom so tried to have a bowel movement.  She states that she had an episode of feeling like she was going to pass out with diaphoresis, nausea and an episode of vomiting.  She states that she had diarrhea and when she wiped there was blood on the tissue and in the toilet.  She states that since then she has had ongoing lower abdominal pain with continued bleeding from her rectum.  She endorses ongoing nausea without further episodes of vomiting.  She does endorse intermittent chills as well as lightheadedness.  She states that she is still tolerating p.o.  She does have history of hemorrhoids however has never had bleeding due to them.  She denies fevers, previous bleeding from her rectum, urinary symptoms, vaginal discharge.  She is not anticoagulated. ? ? ?Abdominal Pain ?Associated symptoms: diarrhea, nausea and vomiting   ?Associated symptoms: no chest pain, no dysuria, no fever, no shortness of breath and no vaginal discharge   ? ?  ? ?Home Medications ?Prior to Admission medications   ?Medication Sig Start Date End Date Taking? Authorizing Provider  ?albuterol (PROVENTIL HFA;VENTOLIN HFA) 108 (90 Base) MCG/ACT inhaler Inhale 1-2 puffs into the lungs every 6 (six) hours as needed for wheezing or shortness of breath. 08/19/16   Dorena Bodo, NP  ?benzocaine-menthol (CHLORASEPTIC) 6-10 MG lozenge Take 1 lozenge by mouth as needed for sore throat. 08/16/16   Janne Napoleon, NP  ?benzonatate (TESSALON) 100 MG capsule Take 1 capsule (100 mg total)  by mouth every 8 (eight) hours. ?Patient not taking: Reported on 09/16/2016 08/19/16   Dorena Bodo, NP  ?cyclobenzaprine (FLEXERIL) 10 MG tablet Take 1 tablet (10 mg total) by mouth 2 (two) times daily as needed for muscle spasms. 09/16/16   Deatra Canter, FNP  ?cyclobenzaprine (FLEXERIL) 10 MG tablet Take 1 tablet (10 mg total) by mouth 3 (three) times daily as needed. 07/06/17   Joni Reining, PA-C  ?FLUoxetine (PROZAC) 20 MG capsule Take 1 capsule by mouth daily. 09/23/14   [provider]  ?HYDROcodone-acetaminophen (NORCO/VICODIN) 5-325 MG tablet Take 1-2 tablets by mouth every 6 hours as needed for pain and/or cough. ?Patient not taking: No sig reported 08/14/15   Pisciotta, Joni Reining, PA-C  ?ibuprofen (ADVIL,MOTRIN) 600 MG tablet Take 1 tablet (600 mg total) by mouth every 8 (eight) hours as needed. 07/06/17   Joni Reining, PA-C  ?methocarbamol (ROBAXIN) 500 MG tablet Take 1 tablet (500 mg total) by mouth 2 (two) times daily as needed for muscle spasms. ?Patient not taking: No sig reported 07/09/16   Everlene Farrier, PA-C  ?naproxen (NAPROSYN) 500 MG tablet Take 1 tablet (500 mg total) by mouth 2 (two) times daily with a meal. 09/16/16   Oxford, Anselm Pancoast, FNP  ?predniSONE (DELTASONE) 20 MG tablet Take 1 tablet (20 mg total) by mouth 2 (two) times daily with a meal. ?Patient not taking: No sig reported 08/19/16   Dorena Bodo, NP  ?sulfamethoxazole-trimethoprim (BACTRIM DS) 800-160 MG tablet  Take 1 tablet by mouth 2 (two) times daily. 05/05/21   Margaretann Loveless, PA-C  ?traZODone (DESYREL) 100 MG tablet Take by mouth. 02/13/14   [provider]  ?   ? ?Allergies    ?Patient has no known allergies.   ? ?Review of Systems   ?Review of Systems  ?Constitutional:  Negative for fever.  ?Respiratory:  Negative for shortness of breath.   ?Cardiovascular:  Negative for chest pain.  ?Gastrointestinal:  Positive for abdominal pain, blood in stool, diarrhea, nausea and vomiting.  ?Genitourinary:   Negative for dysuria and vaginal discharge.  ?Neurological:  Positive for light-headedness. Negative for syncope.  ?All other systems reviewed and are negative. ? ?Physical Exam ?Updated Vital Signs ?BP 112/79 (BP Location: Right Arm)   Pulse 86   Temp 98.2 ?F (36.8 ?C)   Resp 16   Ht 5\' 5"  (1.651 m)   Wt 61.2 kg   LMP 08/24/2021   SpO2 100%   BMI 22.45 kg/m?  ?Physical Exam ?Vitals and nursing note reviewed. Exam conducted with a chaperone present.  ?Constitutional:   ?   General: She is not in acute distress. ?   Appearance: Normal appearance. She is well-developed. She is ill-appearing. She is not toxic-appearing.  ?HENT:  ?   Head: Normocephalic and atraumatic.  ?   Mouth/Throat:  ?   Mouth: Mucous membranes are moist.  ?   Pharynx: Oropharynx is clear.  ?Eyes:  ?   General: No scleral icterus. ?   Pupils: Pupils are equal, round, and reactive to light.  ?Cardiovascular:  ?   Rate and Rhythm: Normal rate and regular rhythm.  ?   Heart sounds: Normal heart sounds. No murmur heard. ?Pulmonary:  ?   Effort: Pulmonary effort is normal. No respiratory distress.  ?   Breath sounds: Normal breath sounds.  ?Abdominal:  ?   General: Abdomen is protuberant. Bowel sounds are decreased.  ?   Palpations: Abdomen is soft.  ?   Tenderness: There is abdominal tenderness in the right lower quadrant, suprapubic area and left lower quadrant. There is no guarding or rebound. Negative signs include Murphy's sign and Rovsing's sign.  ?   Hernia: No hernia is present.  ?Genitourinary: ?   Rectum: Guaiac result negative. External hemorrhoid present. No mass, tenderness or anal fissure.  ?Musculoskeletal:     ?   General: Normal range of motion.  ?   Cervical back: Neck supple.  ?Skin: ?   General: Skin is warm and dry.  ?   Capillary Refill: Capillary refill takes less than 2 seconds.  ?Neurological:  ?   General: No focal deficit present.  ?   Mental Status: She is alert and oriented to person, place, and time. Mental status  is at baseline.  ?Psychiatric:     ?   Mood and Affect: Mood normal.     ?   Behavior: Behavior normal.     ?   Thought Content: Thought content normal.     ?   Judgment: Judgment normal.  ? ? ?ED Results / Procedures / Treatments   ?Labs ?(all labs ordered are listed, but only abnormal results are displayed) ?Labs Reviewed  ?WET PREP, GENITAL - Abnormal; Notable for the following components:  ?    Result Value  ? Yeast Wet Prep HPF POC PRESENT (*)   ? Clue Cells Wet Prep HPF POC PRESENT (*)   ? All other components within normal limits  ?COMPREHENSIVE METABOLIC  PANEL - Abnormal; Notable for the following components:  ? Glucose, Bld 118 (*)   ? All other components within normal limits  ?CBC - Abnormal; Notable for the following components:  ? WBC 13.7 (*)   ? All other components within normal limits  ?URINALYSIS, ROUTINE W REFLEX MICROSCOPIC - Abnormal; Notable for the following components:  ? APPearance HAZY (*)   ? Hgb urine dipstick SMALL (*)   ? Ketones, ur TRACE (*)   ? Protein, ur TRACE (*)   ? All other components within normal limits  ?LIPASE, BLOOD  ?PREGNANCY, URINE  ?OCCULT BLOOD X 1 CARD TO LAB, STOOL  ?GC/CHLAMYDIA PROBE AMP ([place]  ? ?EKG ?None ? ?Radiology ?CT Abdomen Pelvis W Contrast ? ?Result Date: 09/15/2021 ?CLINICAL DATA:  Acute abdominal pain. EXAM: CT ABDOMEN AND PELVIS WITH CONTRAST TECHNIQUE: Multidetector CT imaging of the abdomen and pelvis was performed using the standard protocol following bolus administration of intravenous contrast. RADIATION DOSE REDUCTION: This exam was performed according to the departmental dose-optimization program which includes automated exposure control, adjustment of the mA and/or kV according to patient size and/or use of iterative reconstruction technique. CONTRAST:  80mL OMNIPAQUE IOHEXOL 300 MG/ML  SOLN COMPARISON:  None. FINDINGS: Lower chest: No acute abnormality. Hepatobiliary: No focal liver abnormality is seen. No gallstones,  gallbladder wall thickening, or biliary dilatation. Pancreas: Unremarkable. No pancreatic ductal dilatation or surrounding inflammatory changes. Spleen: Normal in size without focal abnormality. Adrenals/Urinary Tr

## 2021-09-15 NOTE — ED Notes (Signed)
Patient is being discharged from the Urgent Care and sent to the Emergency Department via pov . Per lindsay morgan, patient is in need of higher level of care due to blood in stool, severe abd pain. Patient is aware and verbalizes understanding of plan of care.  ?Vitals:  ? 09/15/21 1046  ?BP: 126/85  ?Pulse: 85  ?Resp: 19  ?Temp: 99.4 ?F (37.4 ?C)  ?SpO2: 96%  ? ? ?

## 2021-09-16 LAB — GC/CHLAMYDIA PROBE AMP (~~LOC~~) NOT AT ARMC
Chlamydia: NEGATIVE
Comment: NEGATIVE
Comment: NORMAL
Neisseria Gonorrhea: NEGATIVE

## 2021-11-02 ENCOUNTER — Telehealth: Payer: Medicaid Other | Admitting: Physician Assistant

## 2021-11-02 DIAGNOSIS — U071 COVID-19: Secondary | ICD-10-CM | POA: Diagnosis not present

## 2021-11-02 MED ORDER — MOLNUPIRAVIR EUA 200MG CAPSULE: 4.0000 | ORAL_CAPSULE | Freq: Two times a day (BID) | ORAL | 0 refills | Status: AC

## 2021-11-02 MED ORDER — ONDANSETRON HCL 4 MG PO TABS: 4.0000 mg | ORAL_TABLET | Freq: Four times a day (QID) | ORAL | 0 refills | Status: DC

## 2021-11-02 MED ORDER — BENZONATATE 100 MG PO CAPS
100.0000 mg | ORAL_CAPSULE | Freq: Three times a day (TID) | ORAL | 0 refills | Status: DC | PRN
Start: 2021-11-02 — End: 2021-12-22

## 2021-11-02 NOTE — Patient Instructions (Signed)
Carol Rice, thank you for joining Margaretann LovelessJennifer M Tonetta Napoles, PA-C for today's virtual visit.  While this provider is not your primary care provider (PCP), if your PCP is located in our provider database this encounter information will be shared with them immediately following your visit. ? ?Consent: ?(Patient) Carol Rice provided verbal consent for this virtual visit at the beginning of the encounter. ? ?Current Medications: ? ?Current Outpatient Medications:  ?  benzonatate (TESSALON) 100 MG capsule, Take 1 capsule (100 mg total) by mouth 3 (three) times daily as needed., Disp: 30 capsule, Rfl: 0 ?  molnupiravir EUA (LAGEVRIO) 200 mg CAPS capsule, Take 4 capsules (800 mg total) by mouth 2 (two) times daily for 5 days., Disp: 40 capsule, Rfl: 0 ?  ondansetron (ZOFRAN) 4 MG tablet, Take 1 tablet (4 mg total) by mouth every 6 (six) hours., Disp: 20 tablet, Rfl: 0  ? ?Medications ordered in this encounter:  ?Meds ordered this encounter  ?Medications  ? ondansetron (ZOFRAN) 4 MG tablet  ?  Sig: Take 1 tablet (4 mg total) by mouth every 6 (six) hours.  ?  Dispense:  20 tablet  ?  Refill:  0  ?  Order Specific Question:   Supervising Provider  ?  Answer:   Eber HongMILLER, BRIAN [[id number]]  ? molnupiravir EUA (LAGEVRIO) 200 mg CAPS capsule  ?  Sig: Take 4 capsules (800 mg total) by mouth 2 (two) times daily for 5 days.  ?  Dispense:  40 capsule  ?  Refill:  0  ?  Order Specific Question:   Supervising Provider  ?  Answer:   Eber HongMILLER, BRIAN [[id number]]  ? benzonatate (TESSALON) 100 MG capsule  ?  Sig: Take 1 capsule (100 mg total) by mouth 3 (three) times daily as needed.  ?  Dispense:  30 capsule  ?  Refill:  0  ?  Order Specific Question:   Supervising Provider  ?  Answer:   Eber HongMILLER, BRIAN [[id number]]  ?  ? ?*If you need refills on other medications prior to your next appointment, please contact your pharmacy* ? ?Follow-Up: ?Call back or seek an in-person evaluation if the symptoms worsen or if the condition fails to improve as  anticipated. ? ?Other Instructions ?10 Things You Can Do to Manage Your COVID-19 Symptoms at Home ?If you have possible or confirmed COVID-19 ?Stay home except to get medical care. ?Monitor your symptoms carefully. If your symptoms get worse, call your healthcare provider immediately. ?Get rest and stay hydrated. ?If you have a medical appointment, call the healthcare provider ahead of time and tell them that you have or may have COVID-19. ?For medical emergencies, call 911 and notify the dispatch personnel that you have or may have COVID-19. ?Cover your cough and sneezes with a tissue or use the inside of your elbow. ?Wash your hands often with soap and water for at least 20 seconds or clean your hands with an alcohol-based hand sanitizer that contains at least 60% alcohol. ?As much as possible, stay in a specific room and away from other people in your home. Also, you should use a separate bathroom, if available. If you need to be around other people in or outside of the home, wear a mask. ?Avoid sharing personal items with other people in your household, like dishes, towels, and bedding. ?Clean all surfaces that are touched often, like counters, tabletops, and doorknobs. Use household cleaning sprays or wipes according to the label instructions. ?[place]/coronavirus ?01/18/2020 ?This information is not  intended to replace advice given to you by your health care provider. Make sure you discuss any questions you have with your health care provider. ?Document Revised: 03/13/2021 Document Reviewed: 03/13/2021 ?Elsevier Patient Education ? 2022 Elsevier Inc. ? ? ?Molnupiravir Capsules ?What is this medication? ?MOLNUPIRAVIR (MOL nue PIR a vir) treats mild to moderate COVID-19. It may help people who are at high risk of developing severe illness. This medication works by limiting the spread of the virus in your body. The FDA has allowed the emergency use of this medication. ?This medicine may be used for other  purposes; ask your health care provider or pharmacist if you have questions. ?COMMON BRAND NAME(S): LAGEVRIO ?What should I tell my care team before I take this medication? ?They need to know if you have any of these conditions: ?Any allergies ?Any serious illness ?An unusual or allergic reaction to molnupiravir, other medications, foods, dyes, or preservatives ?Pregnant or trying to get pregnant ?Breast-feeding ?How should I use this medication? ?Take this medication by mouth with water. Take it as directed on the prescription label at the same time every day. Do not cut, crush, or chew this medication. Swallow the capsules whole. You can take it with or without food. If it upsets your stomach, take it with food. Take all of it unless your care team tells you to stop it early. Keep taking it even if you think you are better. ?Talk to your care team about the use of this medication in children. Special care may be needed. ?Overdosage: If you think you have taken too much of this medicine contact a poison control center or emergency room at once. ?NOTE: This medicine is only for you. Do not share this medicine with others. ?What if I miss a dose? ?If you miss a dose, take it as soon as you can unless it is more than 10 hours late. If it is more than 10 hours late, skip the missed dose. Take the next dose at the normal time. Do not take extra or 2 doses at the same time to make up for the missed dose. ?What may interact with this medication? ?Interactions have not been studied. ?This list may not describe all possible interactions. Give your health care provider a list of all the medicines, herbs, non-prescription drugs, or dietary supplements you use. Also tell them if you smoke, drink alcohol, or use illegal drugs. Some items may interact with your medicine. ?What should I watch for while using this medication? ?Your condition will be monitored carefully while you are receiving this medication. Visit your care team  for regular checkups. Tell your care team if your symptoms do not start to get better or if they get worse. ?Do not become pregnant while taking this medication. You may need a pregnancy test before starting this medication. Women must use a reliable form of birth control while taking this medication and for 4 days after stopping the medication. Women should inform their care team if they wish to become pregnant or think they might be pregnant. Men should not father a child while taking this medication and for 3 months after stopping it. There is potential for serious harm to an unborn child. Talk to your care team for more information. Do not breast-feed an infant while taking this medication and for 4 days after stopping the medication. ?What side effects may I notice from receiving this medication? ?Side effects that you should report to your care team as soon as possible: ?  Allergic reactions--skin rash, itching, hives, swelling of the face, lips, tongue, or throat ?Side effects that usually do not require medical attention (report these to your care team if they continue or are bothersome): ?Diarrhea ?Dizziness ?Nausea ?This list may not describe all possible side effects. Call your doctor for medical advice about side effects. You may report side effects to FDA at 1-800-FDA-1088. ?Where should I keep my medication? ?Keep out of the reach of children and pets. ?Store at room temperature between 20 and 25 degrees C (68 and 77 degrees F). Get rid of any unused medication after the expiration date. ?To get rid of medications that are no longer needed or have expired: ?Take the medication to a medication take-back program. Check with your pharmacy or law enforcement to find a location. ?If you cannot return the medication, check the label or package insert to see if the medication should be thrown out in the garbage or flushed down the toilet. If you are not sure, ask your care team. If it is safe to put it in the  trash, take the medication out of the container. Mix the medication with cat litter, dirt, coffee grounds, or other unwanted substance. Seal the mixture in a bag or container. Put it in the trash. ?NOTE: This shee

## 2021-11-02 NOTE — Progress Notes (Signed)
?Virtual Visit Consent  ? ?Carol Rice, you are scheduled for a virtual visit with a [facility] provider today.   ?  ?Just as with appointments in the office, your consent must be obtained to participate.  Your consent will be active for this visit and any virtual visit you may have with one of our providers in the next 365 days.   ?  ?If you have a MyChart account, a copy of this consent can be sent to you electronically.  All virtual visits are billed to your insurance company just like a traditional visit in the office.   ? ?As this is a virtual visit, video technology does not allow for your provider to perform a traditional examination.  This may limit your provider's ability to fully assess your condition.  If your provider identifies any concerns that need to be evaluated in person or the need to arrange testing (such as labs, EKG, etc.), we will make arrangements to do so.   ?  ?Although advances in technology are sophisticated, we cannot ensure that it will always work on either your end or our end.  If the connection with a video visit is poor, the visit may have to be switched to a telephone visit.  With either a video or telephone visit, we are not always able to ensure that we have a secure connection.    ? ?Also, by engaging in this virtual visit, you consent to the provision of healthcare. Additionally, you authorize for your insurance to be billed (if applicable) for the services provided during this visit.  ? ?I need to obtain your verbal consent now.   Are you willing to proceed with your visit today?  ?  ?Carol Rice has provided verbal consent on 11/02/2021 for a virtual visit (video or telephone). ?  ?Margaretann Loveless, PA-C  ? ?Date: 11/02/2021 10:22 AM  ? ? ?Virtual Visit via Video Note  ? ?Carol Rice, connected with  Carol Rice  ([id number], 11/07/1975) on 11/02/21 at 10:15 AM EDT by a video-enabled telemedicine application and verified that I am speaking  with the correct person using two identifiers. ? ?Location: ?Patient: Virtual Visit Location Patient: Home ?Provider: Virtual Visit Location Provider: Home Office ?  ?I discussed the limitations of evaluation and management by telemedicine and the availability of in person appointments. The patient expressed understanding and agreed to proceed.   ? ?History of Present Illness: ?Carol Rice is a 46 y.o. who identifies as a female who was assigned female at birth, and is being seen today for Covid 55. ? ?HPI: URI  ?This is a new problem. Episode onset: Symptoms started on Saturday, tested positive for COvid 19 Sunday. The problem has been gradually worsening. Maximum temperature: subjective fevers. Associated symptoms include congestion, coughing, headaches, nausea, rhinorrhea and vomiting (once, last night). Pertinent negatives include no chest pain, diarrhea, ear pain, plugged ear sensation, sinus pain or sore throat. Associated symptoms comments: Fatigue. Treatments tried: ibuprofen, dayquil, nyquil generics. The treatment provided no relief.   ? ? ?Problems: There are no problems to display for this patient. ?  ?Allergies: No Known Allergies ?Medications:  ?Current Outpatient Medications:  ?  benzonatate (TESSALON) 100 MG capsule, Take 1 capsule (100 mg total) by mouth 3 (three) times daily as needed., Disp: 30 capsule, Rfl: 0 ?  molnupiravir EUA (LAGEVRIO) 200 mg CAPS capsule, Take 4 capsules (800 mg total) by mouth 2 (two) times daily for 5 days.,  Disp: 40 capsule, Rfl: 0 ?  ondansetron (ZOFRAN) 4 MG tablet, Take 1 tablet (4 mg total) by mouth every 6 (six) hours., Disp: 20 tablet, Rfl: 0 ? ?Observations/Objective: ?Patient is well-developed, well-nourished in no acute distress.  ?Resting comfortably  at home.  ?Head is normocephalic, atraumatic.  ?No labored breathing.  ?Speech is clear and coherent with logical content.  ?Patient is alert and oriented at baseline.  ? ? ?Assessment and Plan: ?1.  COVID-19 ?- ondansetron (ZOFRAN) 4 MG tablet; Take 1 tablet (4 mg total) by mouth every 6 (six) hours.  Dispense: 20 tablet; Refill: 0 ?- molnupiravir EUA (LAGEVRIO) 200 mg CAPS capsule; Take 4 capsules (800 mg total) by mouth 2 (two) times daily for 5 days.  Dispense: 40 capsule; Refill: 0 ?- benzonatate (TESSALON) 100 MG capsule; Take 1 capsule (100 mg total) by mouth 3 (three) times daily as needed.  Dispense: 30 capsule; Refill: 0 ? ?- Continue OTC symptomatic management of choice ?- Will send OTC vitamins and supplement information through AVS ?- Molnupiravir, Zofran and Tessalon perles prescribed ?- Patient enrolled in MyChart symptom monitoring ?- Push fluids ?- Rest as needed ?- Discussed return precautions and when to seek in-person evaluation, sent via AVS as well ? ? ?Follow Up Instructions: ?I discussed the assessment and treatment plan with the patient. The patient was provided an opportunity to ask questions and all were answered. The patient agreed with the plan and demonstrated an understanding of the instructions.  A copy of instructions were sent to the patient via MyChart unless otherwise noted below.  ? ? ?The patient was advised to call back or seek an in-person evaluation if the symptoms worsen or if the condition fails to improve as anticipated. ? ?Time:  ?I spent 13 minutes with the patient via telehealth technology discussing the above problems/concerns.   ? ?Margaretann Loveless, PA-C ?

## 2021-12-22 ENCOUNTER — Telehealth: Payer: Medicaid Other | Admitting: Physician Assistant

## 2021-12-22 DIAGNOSIS — L729 Follicular cyst of the skin and subcutaneous tissue, unspecified: Secondary | ICD-10-CM | POA: Diagnosis not present

## 2021-12-22 DIAGNOSIS — L089 Local infection of the skin and subcutaneous tissue, unspecified: Secondary | ICD-10-CM | POA: Diagnosis not present

## 2021-12-22 MED ORDER — DOXYCYCLINE HYCLATE 100 MG PO TABS: 100.0000 mg | ORAL_TABLET | Freq: Two times a day (BID) | ORAL | 0 refills | Status: DC

## 2021-12-22 NOTE — Progress Notes (Signed)
Virtual Visit Consent   Carol Rice, you are scheduled for a virtual visit with a [facility] provider today. Just as with appointments in the office, your consent must be obtained to participate. Your consent will be active for this visit and any virtual visit you may have with one of our providers in the next 365 days. If you have a MyChart account, a copy of this consent can be sent to you electronically.  As this is a virtual visit, video technology does not allow for your provider to perform a traditional examination. This may limit your provider's ability to fully assess your condition. If your provider identifies any concerns that need to be evaluated in person or the need to arrange testing (such as labs, EKG, etc.), we will make arrangements to do so. Although advances in technology are sophisticated, we cannot ensure that it will always work on either your end or our end. If the connection with a video visit is poor, the visit may have to be switched to a telephone visit. With either a video or telephone visit, we are not always able to ensure that we have a secure connection.  By engaging in this virtual visit, you consent to the provision of healthcare and authorize for your insurance to be billed (if applicable) for the services provided during this visit. Depending on your insurance coverage, you may receive a charge related to this service.  I need to obtain your verbal consent now. Are you willing to proceed with your visit today? Carol Rice has provided verbal consent on 12/22/2021 for a virtual visit (video or telephone). [name], [place]  Date: 12/22/2021 9:39 AM  Virtual Visit via Video Note   I, [name], connected with  Carol Rice  ([id number], 10/20/1975) on 12/22/21 at  9:30 AM EDT by a video-enabled telemedicine application and verified that I am speaking with the correct person using two identifiers.  Location: Patient: Virtual  Visit Location Patient: Home Provider: Virtual Visit Location Provider: Home Office   I discussed the limitations of evaluation and management by telemedicine and the availability of in person appointments. The patient expressed understanding and agreed to proceed.    History of Present Illness: Carol Rice is a 46 y.o. 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 Notes longstanding history of this cyst. Usually not bothersome. States she tried to pop it and express some of the contents at home cause she is tired of having the cyst. Notes a day or so after doing this, the area became inflamed with a "pimple head" on it that she popped with release of some yellow pus. No drainage since then but some continued tenderness. Denies fever, chills. Notes the area feels firmer to her than usual.  HPI: HPI  Problems: There are no problems to display for this patient.   Allergies: No Known Allergies Medications:  Current Outpatient Medications:    doxycycline (VIBRA-TABS) 100 MG tablet, Take 1 tablet (100 mg total) by mouth 2 (two) times daily., Disp: 14 tablet, Rfl: 0  Observations/Objective: Patient is well-developed, well-nourished in no acute distress.  Resting comfortably at home.  Head is normocephalic, atraumatic.  No labored breathing. Speech is clear and coherent with logical content.  Patient is alert and oriented at baseline.  Cystic lesion noted  of L cheek with mild focal swelling. Some surrounding erythema. Is not indurated per patient report with provider-directed palpation.   Assessment and Plan: 1. Infected cyst of skin - doxycycline (VIBRA-TABS) 100 MG tablet; Take 1 tablet (100 mg total) by mouth 2 (two) times daily.  Dispense: 14 tablet; Refill: 0  Skin care discussed. Start warm compresses. Avoid squeezing the area  further. Start Doxycycline as directed. Needs PCP follow-up once calmed down so she can get Dermatology referral for removal if she desires.   Follow Up Instructions: I discussed the assessment and treatment plan with the patient. The patient was provided an opportunity to ask questions and all were answered. The patient agreed with the plan and demonstrated an understanding of the instructions.  A copy of instructions were sent to the patient via MyChart unless otherwise noted below.   The patient was advised to call back or seek an in-person evaluation if the symptoms worsen or if the condition fails to improve as anticipated.  Time:  I spent 10 minutes with the patient via telehealth technology discussing the above problems/concerns.    [name], PA-C

## 2021-12-22 NOTE — Patient Instructions (Addendum)
  [name], thank you for joining [name], PA-C for today's virtual visit.  While this provider is not your primary care provider (PCP), if your PCP is located in our provider database this encounter information will be shared with them immediately following your visit.  Consent: (Patient) [name] provided verbal consent for this virtual visit at the beginning of the encounter.  Current Medications:  Current Outpatient Medications:    benzonatate (TESSALON) 100 MG capsule, Take 1 capsule (100 mg total) by mouth 3 (three) times daily as needed., Disp: 30 capsule, Rfl: 0   ondansetron (ZOFRAN) 4 MG tablet, Take 1 tablet (4 mg total) by mouth every 6 (six) hours., Disp: 20 tablet, Rfl: 0   Medications ordered in this encounter:  No orders of the defined types were placed in this encounter.    *If you need refills on other medications prior to your next appointment, please contact your pharmacy*  Follow-Up: Call back or seek an in-person evaluation if the symptoms worsen or if the condition fails to improve as anticipated.  Other Instructions Please keep the skin clean and dry. Apply warm compress to the area for like 10 minutes, a few times per day.  Use the antibiotic as directed.  If not resolving or any new/worsening symptoms despite treatment, you need an in-person evaluation.    If you have been instructed to have an in-person evaluation today at a local Urgent Care facility, please use the link below. It will take you to a list of all of our available [facility], including address, phone number and hours of operation. Please do not delay care.  [facility]  If you or a family member do not have a primary care provider, use the link below to schedule a visit and establish care. When you choose a [facility] primary care physician or advanced practice provider, you gain a long-term partner in health. Find a Primary Care  Provider  Learn more about [facility]'s in-office and virtual care options:  - Get Care Now

## 2022-01-11 ENCOUNTER — Ambulatory Visit (LOCAL_COMMUNITY_HEALTH_CENTER): Payer: Medicaid Other

## 2022-01-11 DIAGNOSIS — Z111 Encounter for screening for respiratory tuberculosis: Secondary | ICD-10-CM

## 2022-01-14 ENCOUNTER — Ambulatory Visit (LOCAL_COMMUNITY_HEALTH_CENTER): Payer: Self-pay

## 2022-01-14 DIAGNOSIS — Z111 Encounter for screening for respiratory tuberculosis: Secondary | ICD-10-CM

## 2022-01-14 LAB — TB SKIN TEST
Induration: 0 mm
TB Skin Test: NEGATIVE

## 2022-05-01 ENCOUNTER — Telehealth: Payer: Medicaid Other | Admitting: Family

## 2022-05-01 DIAGNOSIS — K047 Periapical abscess without sinus: Secondary | ICD-10-CM

## 2022-05-01 MED ORDER — AMOXICILLIN-POT CLAVULANATE 875-125 MG PO TABS: 1.0000 | ORAL_TABLET | Freq: Two times a day (BID) | ORAL | 0 refills | Status: DC

## 2022-05-01 NOTE — Progress Notes (Signed)
Virtual Visit Consent   Carol Rice, you are scheduled for a virtual visit with a [facility] provider today. Just as with appointments in the office, your consent must be obtained to participate. Your consent will be active for this visit and any virtual visit you may have with one of our providers in the next 365 days. If you have a MyChart account, a copy of this consent can be sent to you electronically.  As this is a virtual visit, video technology does not allow for your provider to perform a traditional examination. This may limit your provider's ability to fully assess your condition. If your provider identifies any concerns that need to be evaluated in person or the need to arrange testing (such as labs, EKG, etc.), we will make arrangements to do so. Although advances in technology are sophisticated, we cannot ensure that it will always work on either your end or our end. If the connection with a video visit is poor, the visit may have to be switched to a telephone visit. With either a video or telephone visit, we are not always able to ensure that we have a secure connection.  By engaging in this virtual visit, you consent to the provision of healthcare and authorize for your insurance to be billed (if applicable) for the services provided during this visit. Depending on your insurance coverage, you may receive a charge related to this service.  I need to obtain your verbal consent now. Are you willing to proceed with your visit today? Carol Rice has provided verbal consent on 05/01/2022 for a virtual visit (video or telephone). Carol Dun, FNP  Date: 05/01/2022 8:22 AM  Virtual Visit via Video Note   I, Carol Rice, connected with  Carol Rice  ([id number], 07/14/1975) on 05/01/22 at  8:30 AM EDT by a video-enabled telemedicine application and verified that I am speaking with the correct person using two identifiers.  Location: Patient: Virtual Visit  Location Patient: Home Provider: Virtual Visit Location Provider: Home Office   I discussed the limitations of evaluation and management by telemedicine and the availability of in person appointments. The patient expressed understanding and agreed to proceed.    History of Present Illness: Carol Rice is a 46 y.o. who identifies as a female who was assigned female at birth, and is being seen today for infected left upper tooth. She reports she has a "bad tooth", but over the last two days she has noticed swelling and tenderness of her gum and cheek. Denies any fever. She is able to talk, swallow, and eat. She does not currently have a dentist.   HPI: HPI  Problems: There are no problems to display for this patient.   Allergies: No Known Allergies Medications:  Current Outpatient Medications:    amoxicillin-clavulanate (AUGMENTIN) 875-125 MG tablet, Take 1 tablet by mouth 2 (two) times daily., Disp: 14 tablet, Rfl: 0  Observations/Objective: Patient is well-developed, well-nourished in no acute distress.  Resting comfortably  at home.  Head is normocephalic, atraumatic.  No labored breathing.  Speech is clear and coherent with logical content.  Patient is alert and oriented at baseline.  Left upper gum swelling  Assessment and Plan: 1. Tooth abscess - amoxicillin-clavulanate (AUGMENTIN) 875-125 MG tablet; Take 1 tablet by mouth 2 (two) times daily.  Dispense: 14 tablet; Refill: 0  Recommend following up with dentist  Start Augmentin  Motrin 600 mg every 6 hours as needed Follow up if symptoms worsen or do  not improve   Follow Up Instructions: I discussed the assessment and treatment plan with the patient. The patient was provided an opportunity to ask questions and all were answered. The patient agreed with the plan and demonstrated an understanding of the instructions.  A copy of instructions were sent to the patient via MyChart unless otherwise noted below.     The  patient was advised to call back or seek an in-person evaluation if the symptoms worsen or if the condition fails to improve as anticipated.  Time:  I spent 9 minutes with the patient via telehealth technology discussing the above problems/concerns.    Jannifer Rodney, FNP

## 2022-05-15 IMAGING — CT CT ABD-PELV W/ CM
2 of 5 series · 16 of 46 positions shown, 18 images · IV contrast (APPLIED)
Comparison: None.

CLINICAL DATA: Acute abdominal pain.

EXAM:
CT ABDOMEN AND PELVIS WITH CONTRAST
TECHNIQUE: Multidetector CT imaging of the abdomen and pelvis was performed
using the standard protocol following bolus administration of
intravenous contrast.

[Series 2: abd pel w · axial · 0.76mm/px · z∈[+522,+892]mm · 13 of 84 slices shown, 15 images]
[im 5/84  soft-tissue]
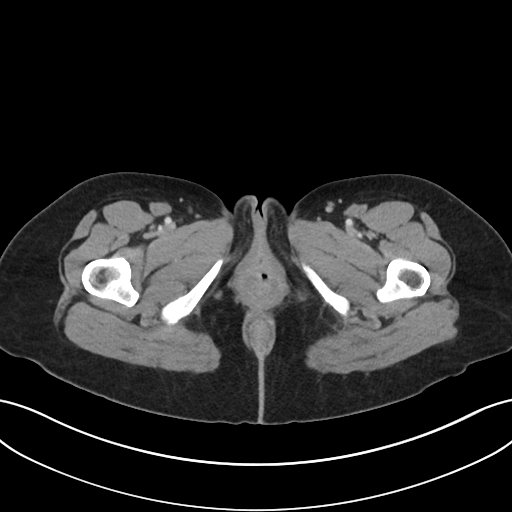
[im 5/84  bone]
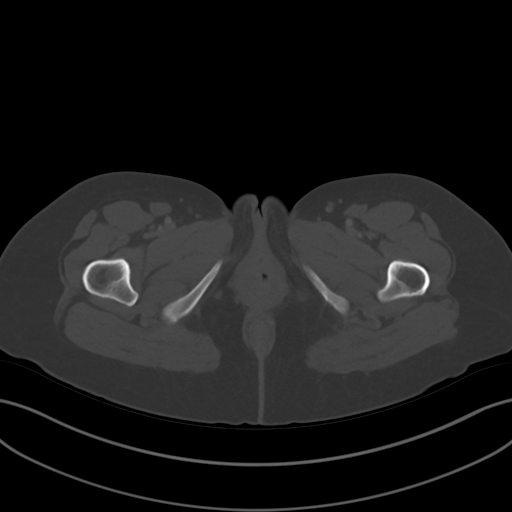
[im 10/84  soft-tissue]
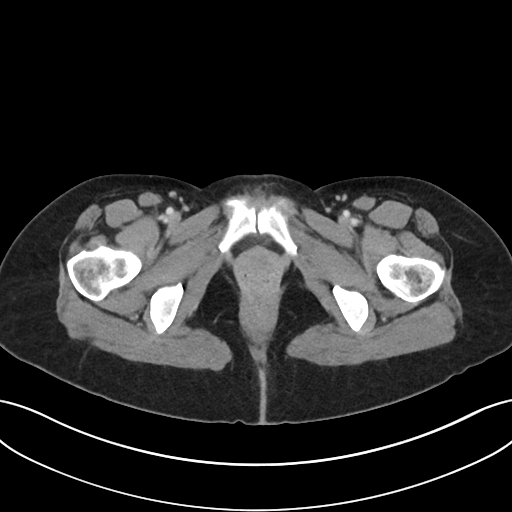
[im 19/84  soft-tissue]
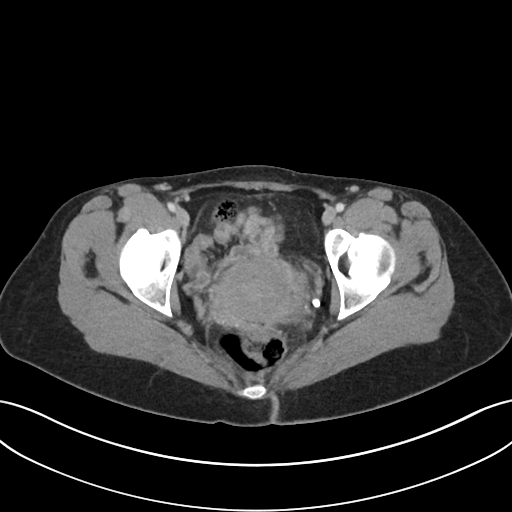
[im 24/84  soft-tissue]
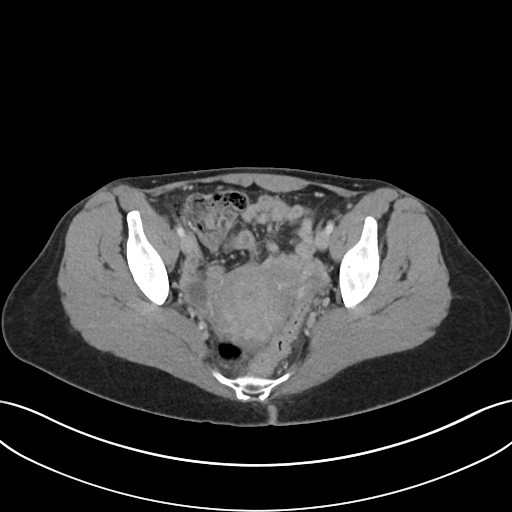
[im 28/84  soft-tissue]
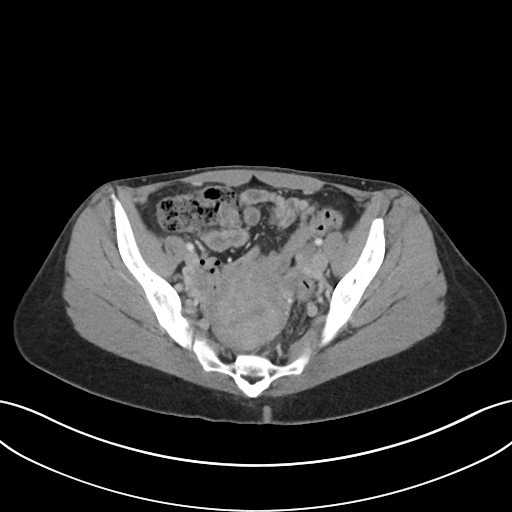
[im 37/84  soft-tissue]
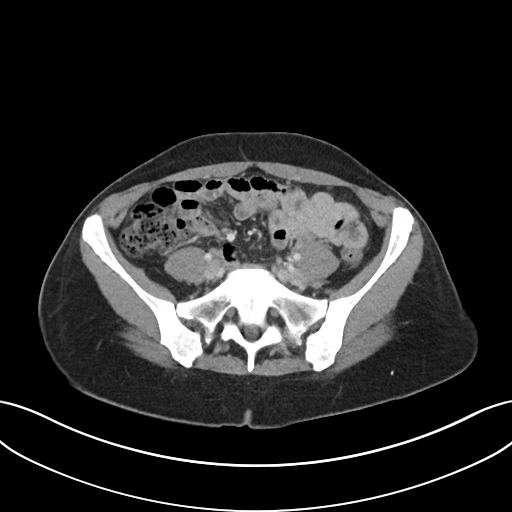
[im 42/84  soft-tissue]
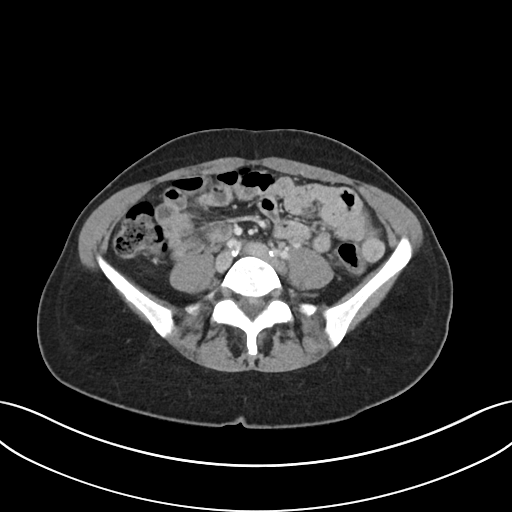
[im 47/84  soft-tissue]
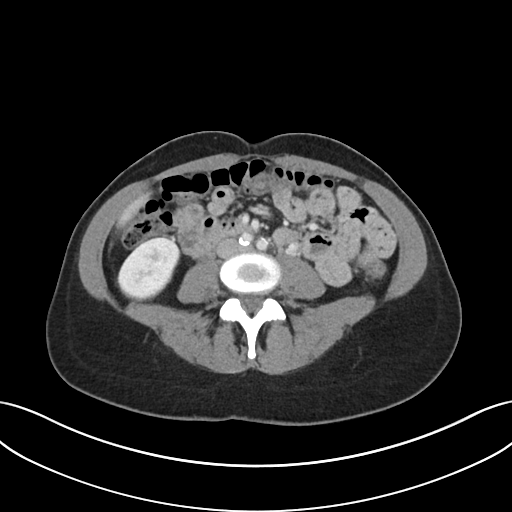
[im 56/84  soft-tissue]
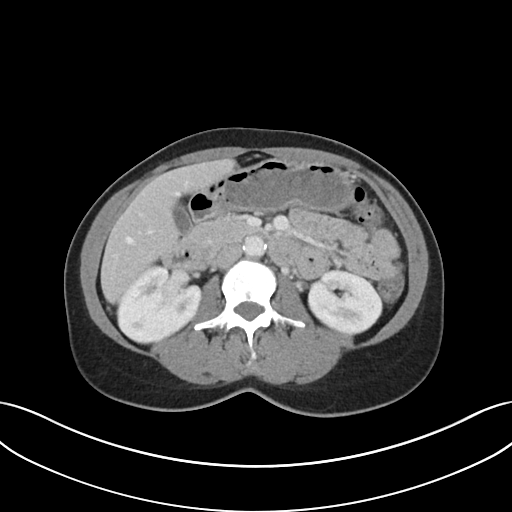
[im 56/84  bone]
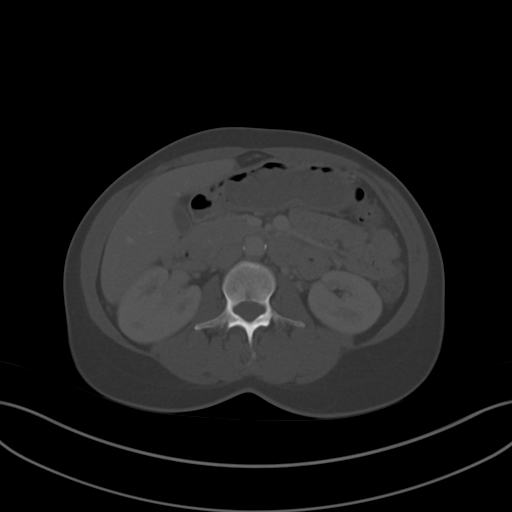
[im 60/84  soft-tissue]
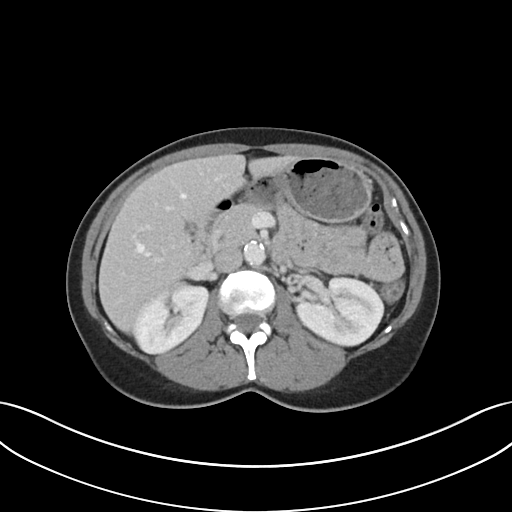
[im 65/84  soft-tissue]
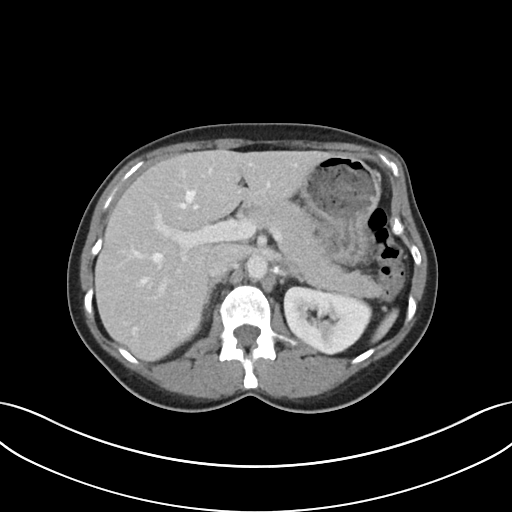
[im 74/84  soft-tissue]
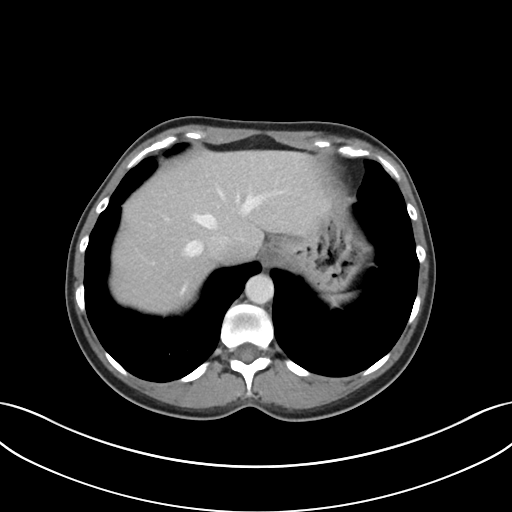
[im 79/84  soft-tissue]
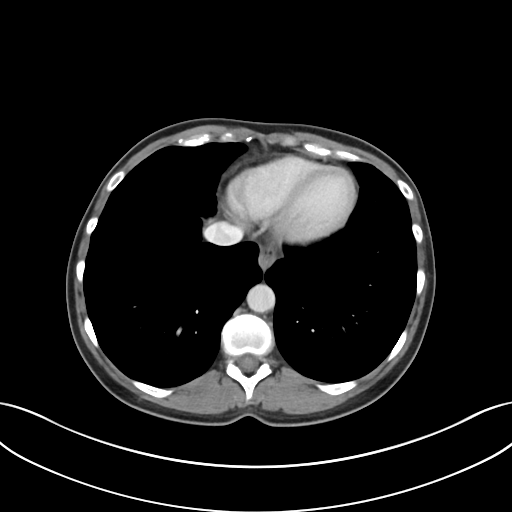

[Series 5: coronal · coronal · 0.72mm/px · 3 of 84 slices shown]
[im 28/84  soft-tissue]
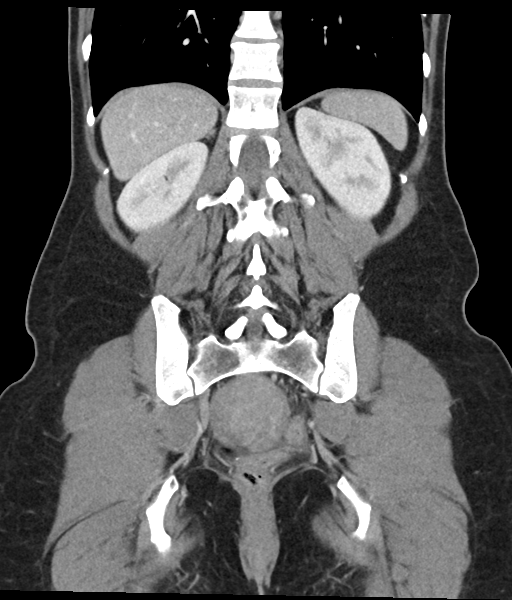
[im 37/84  soft-tissue]
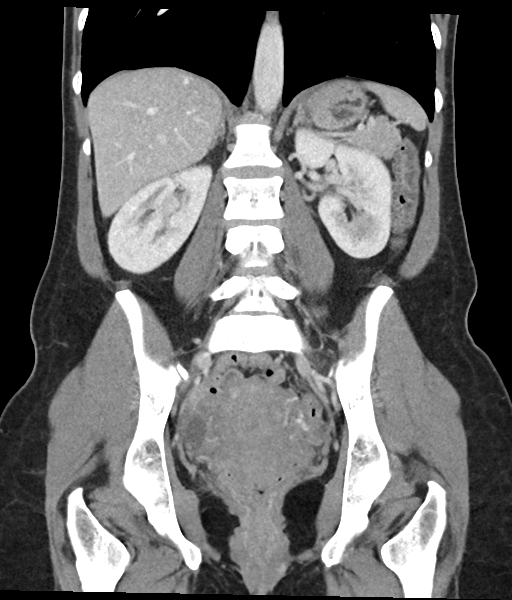
[im 47/84  soft-tissue]
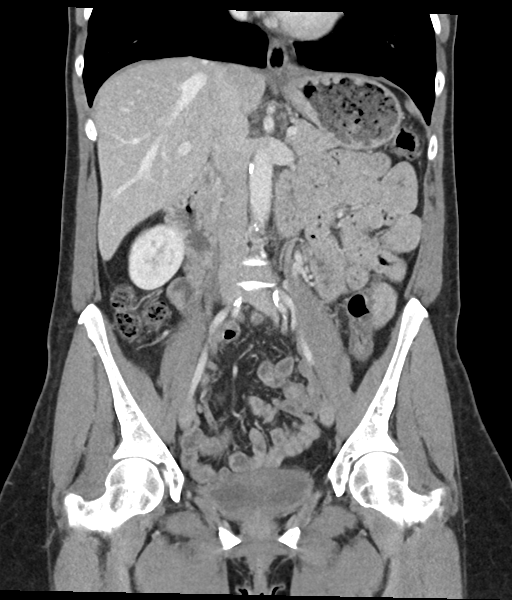

[16 of 46 positions shown; findings below may reference images not displayed]

RADIATION DOSE REDUCTION: This exam was performed according to the
departmental dose-optimization program which includes automated
exposure control, adjustment of the mA and/or kV according to
patient size and/or use of iterative reconstruction technique.

CONTRAST:  80mL OMNIPAQUE IOHEXOL 300 MG/ML  SOLN
FINDINGS: Lower chest: No acute abnormality.

Hepatobiliary: No focal liver abnormality is seen. No gallstones,
gallbladder wall thickening, or biliary dilatation.

Pancreas: Unremarkable. No pancreatic ductal dilatation or
surrounding inflammatory changes.

Spleen: Normal in size without focal abnormality.

Adrenals/Urinary Tract: Adrenal glands are unremarkable. Kidneys are
normal, without renal calculi, focal lesion, or hydronephrosis.
Bladder is unremarkable.

Stomach/Bowel: Stomach is within normal limits. Appendix appears
normal. No evidence of bowel wall thickening, distention, or
inflammatory changes.

Vascular/Lymphatic: Aortic atherosclerosis. No enlarged abdominal or
pelvic lymph nodes.

Reproductive: Uterus and bilateral adnexa are unremarkable.

Other: Small fat containing umbilical hernia. No ascites or free
air.

Musculoskeletal: No acute or significant osseous findings.
IMPRESSION: No acute localizing process in the abdomen or pelvis.

Aortic Atherosclerosis (CXJEG-QJ9.9).

## 2022-08-09 ENCOUNTER — Telehealth: Payer: Medicaid Other | Admitting: Urgent Care

## 2022-08-09 DIAGNOSIS — L02412 Cutaneous abscess of left axilla: Secondary | ICD-10-CM

## 2022-08-09 MED ORDER — MUPIROCIN 2 % EX OINT: 1.0000 | TOPICAL_OINTMENT | Freq: Three times a day (TID) | CUTANEOUS | 0 refills | Status: AC

## 2022-08-09 MED ORDER — CLINDAMYCIN HCL 300 MG PO CAPS: 300.0000 mg | ORAL_CAPSULE | Freq: Three times a day (TID) | ORAL | 0 refills | Status: AC

## 2022-08-09 MED ORDER — FLUCONAZOLE 150 MG PO TABS: ORAL_TABLET | ORAL | 0 refills | Status: DC

## 2022-08-09 NOTE — Progress Notes (Signed)
Virtual Visit Consent   Carol Rice, you are scheduled for a virtual visit with a [facility] provider today. Just as with appointments in the office, your consent must be obtained to participate. Your consent will be active for this visit and any virtual visit you may have with one of our providers in the next 365 days. If you have a MyChart account, a copy of this consent can be sent to you electronically.  As this is a virtual visit, video technology does not allow for your provider to perform a traditional examination. This may limit your provider's ability to fully assess your condition. If your provider identifies any concerns that need to be evaluated in person or the need to arrange testing (such as labs, EKG, etc.), we will make arrangements to do so. Although advances in technology are sophisticated, we cannot ensure that it will always work on either your end or our end. If the connection with a video visit is poor, the visit may have to be switched to a telephone visit. With either a video or telephone visit, we are not always able to ensure that we have a secure connection.  By engaging in this virtual visit, you consent to the provision of healthcare and authorize for your insurance to be billed (if applicable) for the services provided during this visit. Depending on your insurance coverage, you may receive a charge related to this service.  I need to obtain your verbal consent now. Are you willing to proceed with your visit today? Carol Rice has provided verbal consent on 08/09/2022 for a virtual visit (video or telephone). Chaney Malling, PA  Date: 08/09/2022 4:21 PM  Virtual Visit via Video Note   I, [facility], connected with  Carol Rice  ([id number], 05/17/1976) on 08/09/22 at  3:15 PM EST by a video-enabled telemedicine application and verified that I am speaking with the correct person using two identifiers.  Location: Patient: Virtual Visit Location  Patient: Home Provider: Virtual Visit Location Provider: Home Office   I discussed the limitations of evaluation and management by telemedicine and the availability of in person appointments. The patient expressed understanding and agreed to proceed.    History of Present Illness: Carol Rice is a 47 y.o. who identifies as a female who was assigned female at birth, and is being seen today for ruptured cyst.  HPI: 47 yo female with known hx of cysts and abscesses in the past presents today primarily to request antibiotics. States she had what seemed to be a boil to her L axilla, but three days ago states it ruptured. This helped decrease the pain, but pt states it is still draining purulent material. She denies fever. Reports hx of the same in the same location in the past. Denies known MRSA. No known hx of HS. Has tried warm compresses intermittently. No additional tx tried. Pt has used doxy in the past for similar lesions on her face but states she doesn't tolerate this well.   Problems: There are no problems to display for this patient.   Allergies: No Known Allergies Medications:  Current Outpatient Medications:    clindamycin (CLEOCIN) 300 MG capsule, Take 1 capsule (300 mg total) by mouth 3 (three) times daily for 7 days., Disp: 21 capsule, Rfl: 0   fluconazole (DIFLUCAN) 150 MG tablet, Take one tab PO if symptoms of vaginal yeast infection occur. May repeat in 72 hours if needed, Disp: 2 tablet, Rfl: 0  mupirocin ointment (BACTROBAN) 2 %, Apply 1 Application topically 3 (three) times daily for 7 days., Disp: 22 g, Rfl: 0  Observations/Objective: Patient is well-developed, well-nourished in no acute distress.  Resting comfortably at home.  Head is normocephalic, atraumatic.  No labored breathing.  Speech is clear and coherent with logical content.  Patient is alert and oriented at baseline.  Ruptured cyst to L axilla without surrounding cellulitis. FROM of shoulder. No  swelling noted  Assessment and Plan: 1. Abscess of left axilla  Will start clindamycin PO in addition to mupirocin ointment TID to cover for possible MRSA. This also will aid in clearance if HS. It is already self-lysed therefore I&D not indicated. Pt believes she may have a cyst there, encouraged pt to follow up with dermatology to see if cyst excision indicated.  Follow Up Instructions: I discussed the assessment and treatment plan with the patient. The patient was provided an opportunity to ask questions and all were answered. The patient agreed with the plan and demonstrated an understanding of the instructions.  A copy of instructions were sent to the patient via MyChart unless otherwise noted below.    The patient was advised to call back or seek an in-person evaluation if the symptoms worsen or if the condition fails to improve as anticipated.  Time:  I spent 6 minutes with the patient via telehealth technology discussing the above problems/concerns.    [place], PA

## 2022-08-09 NOTE — Patient Instructions (Signed)
[employer], thank you for joining Chaney Malling, PA for today's virtual visit.  While this provider is not your primary care provider (PCP), if your PCP is located in our provider database this encounter information will be shared with them immediately following your visit.   [facility] account gives you access to today's visit and all your visits, tests, and labs performed at [facility] " click here if you don't have a [facility] account or go to [email or web address]  Consent: (Patient) Carol Rice provided verbal consent for this virtual visit at the beginning of the encounter.  Current Medications:  Current Outpatient Medications:    clindamycin (CLEOCIN) 300 MG capsule, Take 1 capsule (300 mg total) by mouth 3 (three) times daily for 7 days., Disp: 21 capsule, Rfl: 0   fluconazole (DIFLUCAN) 150 MG tablet, Take one tab PO if symptoms of vaginal yeast infection occur. May repeat in 72 hours if needed, Disp: 2 tablet, Rfl: 0   mupirocin ointment (BACTROBAN) 2 %, Apply 1 Application topically 3 (three) times daily for 7 days., Disp: 22 g, Rfl: 0   Medications ordered in this encounter:  Meds ordered this encounter  Medications   clindamycin (CLEOCIN) 300 MG capsule    Sig: Take 1 capsule (300 mg total) by mouth 3 (three) times daily for 7 days.    Dispense:  21 capsule    Refill:  0    Order Specific Question:   Supervising Provider    Answer:   Chase Picket [[id number]]   fluconazole (DIFLUCAN) 150 MG tablet    Sig: Take one tab PO if symptoms of vaginal yeast infection occur. May repeat in 72 hours if needed    Dispense:  2 tablet    Refill:  0    Order Specific Question:   Supervising Provider    Answer:   Chase Picket [[id number]]   mupirocin ointment (BACTROBAN) 2 %    Sig: Apply 1 Application topically 3 (three) times daily for 7 days.    Dispense:  22 g    Refill:  0    Order Specific Question:   Supervising  Provider    Answer:   Chase Picket [id number]     *If you need refills on other medications prior to your next appointment, please contact your pharmacy*  Follow-Up: Call back or seek an in-person evaluation if the symptoms worsen or if the condition fails to improve as anticipated.  [facility] [phone]  Other Instructions Start taking clindamycin three times daily. This may cause adverse gastrointestinal symptoms, so please take with food. If you develop severe uncontrolled diarrhea please stop the medication and seek consultation. Take an OTC probiotic while on the medication. Please continue warm compresses to the affected area of your armpit three times daily. After each compress, apply the topical mupirocin ointment. Follow up with dermatology. Only take the diflucan if symptoms of a yeast infection occur.    If you have been instructed to have an in-person evaluation today at a local Urgent Care facility, please use the link below. It will take you to a list of all of our available [facility], including address, phone number and hours of operation. Please do not delay care.  [facility]  If you or a family member do not have a primary care provider, use the link below to schedule a visit and establish care. When you choose a  [facility] primary care physician or advanced practice provider, you gain a long-term partner in health. Find a Primary Care Provider  Learn more about 's in-office and virtual care options: [facility]

## 2022-10-07 DIAGNOSIS — Z720 Tobacco use: Secondary | ICD-10-CM | POA: Insufficient documentation

## 2022-10-07 DIAGNOSIS — I10 Essential (primary) hypertension: Secondary | ICD-10-CM | POA: Insufficient documentation

## 2022-12-03 DIAGNOSIS — N8003 Adenomyosis of the uterus: Secondary | ICD-10-CM | POA: Insufficient documentation

## 2023-01-07 DIAGNOSIS — N83209 Unspecified ovarian cyst, unspecified side: Secondary | ICD-10-CM | POA: Insufficient documentation

## 2023-02-18 ENCOUNTER — Telehealth: Payer: Medicaid Other | Admitting: Physician Assistant

## 2023-02-18 DIAGNOSIS — N760 Acute vaginitis: Secondary | ICD-10-CM | POA: Diagnosis not present

## 2023-02-18 DIAGNOSIS — B9689 Other specified bacterial agents as the cause of diseases classified elsewhere: Secondary | ICD-10-CM

## 2023-02-18 MED ORDER — FLUCONAZOLE 150 MG PO TABS: 150.0000 mg | ORAL_TABLET | Freq: Once | ORAL | 0 refills | Status: AC

## 2023-02-18 MED ORDER — METRONIDAZOLE 0.75 % EX GEL
CUTANEOUS | 0 refills | Status: AC
Start: 2023-02-18 — End: ?

## 2023-02-18 NOTE — Progress Notes (Signed)
Virtual Visit Consent   Carol Rice, you are scheduled for a virtual visit with a [facility] provider today. Just as with appointments in the office, your consent must be obtained to participate. Your consent will be active for this visit and any virtual visit you may have with one of our providers in the next 365 days. If you have a MyChart account, a copy of this consent can be sent to you electronically.  As this is a virtual visit, video technology does not allow for your provider to perform a traditional examination. This may limit your provider's ability to fully assess your condition. If your provider identifies any concerns that need to be evaluated in person or the need to arrange testing (such as labs, EKG, etc.), we will make arrangements to do so. Although advances in technology are sophisticated, we cannot ensure that it will always work on either your end or our end. If the connection with a video visit is poor, the visit may have to be switched to a telephone visit. With either a video or telephone visit, we are not always able to ensure that we have a secure connection.  By engaging in this virtual visit, you consent to the provision of healthcare and authorize for your insurance to be billed (if applicable) for the services provided during this visit. Depending on your insurance coverage, you may receive a charge related to this service.  I need to obtain your verbal consent now. Are you willing to proceed with your visit today? [name] has provided verbal consent on 02/18/2023 for a virtual visit (video or telephone). Carol Rice, [place]  Date: 02/18/2023 10:38 AM  Virtual Visit via Video Note   I, Carol Rice, connected with  Carol Rice  ([id number], 1976-05-26) on 02/18/23 at 10:45 AM EDT by a video-enabled telemedicine application and verified that I am speaking with the correct person using two identifiers.  Location: Patient: Virtual  Visit Location Patient: Home Provider: Virtual Visit Location Provider: Home Office   I discussed the limitations of evaluation and management by telemedicine and the availability of in person appointments. The patient expressed understanding and agreed to proceed.    History of Present Illness: Carol Rice is a 47 y.o. who identifies as a female who was assigned female at birth, and is being seen today for possible bv. Endorses prior history of bacterial vaginosis and currently having identical symptoms to prior. Notes 4-5 days of vaginal odor with discharge. Denies change in urinary habits. Denies recent antibiotic use. LMP ended 2.5 weeks ago. Denies concern for pregnancy or STI.   HPI: HPI  Problems: There are no problems to display for this patient.   Allergies: No Known Allergies Medications:  Current Outpatient Medications:    chlorthalidone (HYGROTON) 25 MG tablet, Take by mouth., Disp: , Rfl:   Observations/Objective: Patient is well-developed, well-nourished in no acute distress.  Resting comfortably at home.  Head is normocephalic, atraumatic.  No labored breathing. Speech is clear and coherent with logical content.  Patient is alert and oriented at baseline.   Assessment and Plan: 1. Bacterial vaginosis  Prior history with classic symptoms. Denies concern for pregnancy or STI. Will treat with Metrogel per pt preference. Diflucan placed on file just in case of antibiotic-induced yeast infection. Strict in-person evaluation precautions reviewed with patient.   Follow Up Instructions: I discussed the assessment and treatment plan with the patient. The patient was provided an opportunity to ask questions  and all were answered. The patient agreed with the plan and demonstrated an understanding of the instructions.  A copy of instructions were sent to the patient via MyChart unless otherwise noted below.   The patient was advised to call back or seek an in-person  evaluation if the symptoms worsen or if the condition fails to improve as anticipated.  Time:  I spent 10 minutes with the patient via telehealth technology discussing the above problems/concerns.    Carol Climes, PA-C

## 2023-02-18 NOTE — Patient Instructions (Signed)
Carol Rice, thank you for joining [name], PA-C for today's virtual visit.  While this provider is not your primary care provider (PCP), if your PCP is located in our provider database this encounter information will be shared with them immediately following your visit.   A [facility] MyChart account gives you access to today's visit and all your visits, tests, and labs performed at [facility] " click here if you don't have a [facility] MyChart account or go to [email or web address]  Consent: (Patient) Carol Rice provided verbal consent for this virtual visit at the beginning of the encounter.  Current Medications:  Current Outpatient Medications:    chlorthalidone (HYGROTON) 25 MG tablet, Take by mouth., Disp: , Rfl:    fluconazole (DIFLUCAN) 150 MG tablet, Take 1 tablet (150 mg total) by mouth once for 1 dose., Disp: 1 tablet, Rfl: 0   metroNIDAZOLE (METROGEL) 0.75 % gel, Insert one applicatorful (5g) of medicine into the vagina once nightly x 5 days, Disp: 25 g, Rfl: 0   Medications ordered in this encounter:  Meds ordered this encounter  Medications   metroNIDAZOLE (METROGEL) 0.75 % gel    Sig: Insert one applicatorful (5g) of medicine into the vagina once nightly x 5 days    Dispense:  25 g    Refill:  0    Order Specific Question:   Supervising Provider    Answer:   Merrilee Jansky [[id number]]   fluconazole (DIFLUCAN) 150 MG tablet    Sig: Take 1 tablet (150 mg total) by mouth once for 1 dose.    Dispense:  1 tablet    Refill:  0    Order Specific Question:   Supervising Provider    Answer:   Merrilee Jansky [id number]     *If you need refills on other medications prior to your next appointment, please contact your pharmacy*  Follow-Up: Call back or seek an in-person evaluation if the symptoms worsen or if the condition fails to improve as anticipated.  [facility] [phone]  Other  Instructions Bacterial Vaginosis  Bacterial vaginosis is an infection of the vagina. It happens when too many normal germs (healthy bacteria) grow in the vagina. This infection can make it easier to get other infections from sex (STIs). It is very important for pregnant women to get treated. This infection can cause babies to be born early or at a low birth weight. What are the causes? This infection is caused by an increase in certain germs that grow in the vagina. You cannot get this infection from toilet seats, bedsheets, swimming pools, or things that touch your vagina. What increases the risk? Having sex with a new person or more than one person. Having sex without protection. Douching. Having an intrauterine device (IUD). Smoking. Using drugs or drinking alcohol. These can lead you to do things that are risky. Taking certain antibiotic medicines. Being pregnant. What are the signs or symptoms? Some women have no symptoms. Symptoms may include: A discharge from your vagina. It may be gray or white. It can be watery or foamy. A fishy smell. This can happen after sex or during your menstrual period. Itching in and around your vagina. A feeling of burning or pain when you pee (urinate). How is this treated? This infection is treated with antibiotic medicines. These may be given to you as: A pill. A cream for your vagina. A medicine that you put into your vagina (suppository). If the  infection comes back after treatment, you may need more antibiotics. Follow these instructions at home: Medicines Take over-the-counter and prescription medicines as told by your doctor. Take or use your antibiotic medicine as told by your doctor. Do not stop taking or using it, even if you start to feel better. General instructions If the person you have sex with is a woman, tell her that you have this infection. She will need to follow up with her doctor. If you have a female partner, he does not need  to be treated. Do not have sex until you finish treatment. Drink enough fluid to keep your pee pale yellow. Keep your vagina and butt clean. Wash the area with warm water each day. Wipe from front to back after you use the toilet. If you are breastfeeding a baby, ask your doctor if you should keep doing so during treatment. Keep all follow-up visits. How is this prevented? Self-care Do not douche. Use only warm water to wash around your vagina. Wear underwear that is cotton or lined with cotton. Do not wear tight pants and pantyhose, especially in the summer. Safe sex Use protection when you have sex. This includes: Use condoms. Use dental dams. This is a thin layer that protects the mouth during oral sex. Limit how many people you have sex with. To prevent this infection, it is best to have sex with just one person. Get tested for STIs. The person you have sex with should also get tested. Drugs and alcohol Do not smoke or use any products that contain nicotine or tobacco. If you need help quitting, ask your doctor. Do not use drugs. Do not drink alcohol if: Your doctor tells you not to drink. You are pregnant, may be pregnant, or are planning to become pregnant. If you drink alcohol: Limit how much you have to 0-1 drink a day. Know how much alcohol is in your drink. In the [place] one drink equals one 12 oz bottle of beer (355 mL), one 5 oz glass of wine (148 mL), or one 1 oz glass of hard liquor (44 mL). Where to find more information Centers for Disease Control and Prevention: FootballExhibition.com.br American Sexual Health Association: www.ashastd.org Office on [employer] Health: http://hoffman.com/ Contact a doctor if: Your symptoms do not get better, even after you are treated. You have more discharge or pain when you pee. You have a fever or chills. You have pain in your belly (abdomen) or in the area between your hips. You have pain with sex. You bleed from your vagina between  menstrual periods. Summary This infection can happen when too many germs (bacteria) grow in the vagina. This infection can make it easier to get infections from sex (STIs). Treating this can lower that chance. Get treated if you are pregnant. This infection can cause babies to be born early. Do not stop taking or using your antibiotic medicine, even if you start to feel better. This information is not intended to replace advice given to you by your health care provider. Make sure you discuss any questions you have with your health care provider. Document Revised: 12/20/2019 Document Reviewed: 12/20/2019 Elsevier Patient Education  2024 Elsevier Inc.    If you have been instructed to have an in-person evaluation today at a local Urgent Care facility, please use the link below. It will take you to a list of all of our available [facility], including address, phone number and hours of operation. Please do not delay care.  [facility]  If you or a family member do not have a primary care provider, use the link below to schedule a visit and establish care. When you choose a [facility] primary care physician or advanced practice provider, you gain a long-term partner in health. Find a Primary Care Provider  Learn more about [facility] in-office and virtual care options: [facility] - Get Care Now

## 2023-05-06 DIAGNOSIS — N939 Abnormal uterine and vaginal bleeding, unspecified: Secondary | ICD-10-CM | POA: Insufficient documentation

## 2023-08-12 DIAGNOSIS — Z2821 Immunization not carried out because of patient refusal: Secondary | ICD-10-CM | POA: Insufficient documentation

## 2023-12-16 ENCOUNTER — Telehealth: Admitting: Nurse Practitioner

## 2023-12-16 DIAGNOSIS — G43909 Migraine, unspecified, not intractable, without status migrainosus: Secondary | ICD-10-CM | POA: Insufficient documentation

## 2023-12-16 DIAGNOSIS — K047 Periapical abscess without sinus: Secondary | ICD-10-CM | POA: Diagnosis not present

## 2023-12-16 DIAGNOSIS — R413 Other amnesia: Secondary | ICD-10-CM | POA: Insufficient documentation

## 2023-12-16 DIAGNOSIS — G8929 Other chronic pain: Secondary | ICD-10-CM | POA: Insufficient documentation

## 2023-12-16 MED ORDER — PENICILLIN V POTASSIUM 500 MG PO TABS: 500.0000 mg | ORAL_TABLET | Freq: Three times a day (TID) | ORAL | 0 refills | Status: AC

## 2023-12-16 NOTE — Progress Notes (Signed)
 Virtual Visit Consent   Carol Rice, you are scheduled for a virtual visit with a [facility] provider today. Just as with appointments in the office, your consent must be obtained to participate. Your consent will be active for this visit and any virtual visit you may have with one of our providers in the next 365 days. If you have a MyChart account, a copy of this consent can be sent to you electronically.  As this is a virtual visit, video technology does not allow for your provider to perform a traditional examination. This may limit your provider's ability to fully assess your condition. If your provider identifies any concerns that need to be evaluated in person or the need to arrange testing (such as labs, EKG, etc.), we will make arrangements to do so. Although advances in technology are sophisticated, we cannot ensure that it will always work on either your end or our end. If the connection with a video visit is poor, the visit may have to be switched to a telephone visit. With either a video or telephone visit, we are not always able to ensure that we have a secure connection.  By engaging in this virtual visit, you consent to the provision of healthcare and authorize for your insurance to be billed (if applicable) for the services provided during this visit. Depending on your insurance coverage, you may receive a charge related to this service.  I need to obtain your verbal consent now. Are you willing to proceed with your visit today? Carol Rice has provided verbal consent on 12/16/2023 for a virtual visit (video or telephone). Mardene Shake, FNP  Date: 12/16/2023 3:07 PM   Virtual Visit via Video Note   I, Mardene Shake, connected with  Carol Rice  ([id number], 02/19/1976) on 12/16/23 at  3:15 PM EDT by a video-enabled telemedicine application and verified that I am speaking with the correct person using two identifiers.  Location: Patient: Virtual Visit Location  Patient: Home Provider: Virtual Visit Location Provider: Home Office   I discussed the limitations of evaluation and management by telemedicine and the availability of in person appointments. The patient expressed understanding and agreed to proceed.    History of Present Illness: Carol Rice is a 48 y.o. who identifies as a female who was assigned female at birth, and is being seen today for swollen gums and jaw pain  She has a known broken tooth on the left upper side. She has been to the dentist and had other teeth extracted but not the broken one yet.   Today her gums are tender around the tooth and the side of her left face is swollen  She believes she may have had a slight fever overnight   She is currently at work during the call today in no acute distress  She has already called the [facility] urgent dental office for an appointment   Problems: There are no active problems to display for this patient.   Allergies: No Known Allergies Medications:  Current Outpatient Medications:    chlorthalidone (HYGROTON) 25 MG tablet, Take by mouth., Disp: , Rfl:    metroNIDAZOLE  (METROGEL ) 0.75 % gel, Insert one applicatorful (5g) of medicine into the vagina once nightly x 5 days, Disp: 25 g, Rfl: 0  Observations/Objective: Patient is well-developed, well-nourished in no acute distress.  Resting comfortably  at home.  Head is normocephalic, atraumatic.  No labored breathing.  Speech is clear and coherent with logical content.  Patient  is alert and oriented at baseline.    Assessment and Plan: 1. Dental infection  Follow up with dentist next week as scheduled   Meds ordered this encounter  Medications   penicillin v potassium (VEETID) 500 MG tablet    Sig: Take 1 tablet (500 mg total) by mouth 3 (three) times daily for 7 days.    Dispense:  21 tablet    Refill:  0     Follow Up Instructions: I discussed the assessment and treatment plan with the patient. The patient was  provided an opportunity to ask questions and all were answered. The patient agreed with the plan and demonstrated an understanding of the instructions.  A copy of instructions were sent to the patient via MyChart unless otherwise noted below.    The patient was advised to call back or seek an in-person evaluation if the symptoms worsen or if the condition fails to improve as anticipated.    Mardene Shake, FNP

## 2024-05-07 ENCOUNTER — Telehealth: Admitting: Physician Assistant

## 2024-05-07 DIAGNOSIS — B9689 Other specified bacterial agents as the cause of diseases classified elsewhere: Secondary | ICD-10-CM

## 2024-05-07 DIAGNOSIS — N760 Acute vaginitis: Secondary | ICD-10-CM

## 2024-05-07 MED ORDER — METRONIDAZOLE 500 MG PO TABS: 500.0000 mg | ORAL_TABLET | Freq: Two times a day (BID) | ORAL | 0 refills | Status: AC

## 2024-05-07 NOTE — Patient Instructions (Signed)
  Carol Rice, thank you for joining [name], PA-C for today's virtual visit.  While this provider is not your primary care provider (PCP), if your PCP is located in our provider database this encounter information will be shared with them immediately following your visit.   A [facility] MyChart account gives you access to today's visit and all your visits, tests, and labs performed at [facility]  click here if you don't have a [facility] MyChart account or go to [email or web address]  Consent: (Patient) Carol Rice provided verbal consent for this virtual visit at the beginning of the encounter.  Current Medications:  Current Outpatient Medications:    metroNIDAZOLE  (FLAGYL ) 500 MG tablet, Take 1 tablet (500 mg total) by mouth 2 (two) times daily for 7 days., Disp: 14 tablet, Rfl: 0   chlorthalidone (HYGROTON) 25 MG tablet, Take by mouth., Disp: , Rfl:    metroNIDAZOLE  (METROGEL ) 0.75 % gel, Insert one applicatorful (5g) of medicine into the vagina once nightly x 5 days, Disp: 25 g, Rfl: 0   Medications ordered in this encounter:  Meds ordered this encounter  Medications   metroNIDAZOLE  (FLAGYL ) 500 MG tablet    Sig: Take 1 tablet (500 mg total) by mouth 2 (two) times daily for 7 days.    Dispense:  14 tablet    Refill:  0    Supervising Provider:   BLAISE ALEENE KIDD [[id number]]     *If you need refills on other medications prior to your next appointment, please contact your pharmacy*  Follow-Up: Call back or seek an in-person evaluation if the symptoms worsen or if the condition fails to improve as anticipated.  [facility] [phone]  Other Instructions Take Flagyl  as prescribed.  If no improvement follow up for in person evaluation.    If you have been instructed to have an in-person evaluation today at a local Urgent Care facility, please use the link below. It will take you to a list of all of our available [facility], including address, phone number and hours of operation. Please do not delay care.  [facility]  If you or a family member do not have a primary care provider, use the link below to schedule a visit and establish care. When you choose a [facility] primary care physician or advanced practice provider, you gain a long-term partner in health. Find a Primary Care Provider  Learn more about [facility]'s in-office and virtual care options: [facility] - Get Care Now
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
## 2024-06-14 ENCOUNTER — Telehealth

## 2024-06-15 ENCOUNTER — Telehealth: Admitting: Physician Assistant

## 2024-06-15 DIAGNOSIS — B359 Dermatophytosis, unspecified: Secondary | ICD-10-CM

## 2024-06-15 MED ORDER — TERBINAFINE HCL 250 MG PO TABS: 250.0000 mg | ORAL_TABLET | Freq: Every day | ORAL | 0 refills | Status: AC

## 2024-06-15 NOTE — Patient Instructions (Signed)
 Carol Rice, thank you for joining Delon CHRISTELLA Dickinson, PA-C for today's virtual visit.  While this provider is not your primary care provider (PCP), if your PCP is located in our provider database this encounter information will be shared with them immediately following your visit.   A [facility] MyChart account gives you access to today's visit and all your visits, tests, and labs performed at [facility]  click here if you don't have a [facility] MyChart account or go to [email or web address]  Consent: (Patient) Carol Rice provided verbal consent for this virtual visit at the beginning of the encounter.  Current Medications:  Current Outpatient Medications:    terbinafine (LAMISIL) 250 MG tablet, Take 1 tablet (250 mg total) by mouth daily., Disp: 14 tablet, Rfl: 0   chlorthalidone (HYGROTON) 25 MG tablet, Take by mouth., Disp: , Rfl:    metroNIDAZOLE  (METROGEL ) 0.75 % gel, Insert one applicatorful (5g) of medicine into the vagina once nightly x 5 days, Disp: 25 g, Rfl: 0   Medications ordered in this encounter:  Meds ordered this encounter  Medications   terbinafine (LAMISIL) 250 MG tablet    Sig: Take 1 tablet (250 mg total) by mouth daily.    Dispense:  14 tablet    Refill:  0    Supervising Provider:   BLAISE ALEENE KIDD [[id number]]     *If you need refills on other medications prior to your next appointment, please contact your pharmacy*  Follow-Up: Call back or seek an in-person evaluation if the symptoms worsen or if the condition fails to improve as anticipated.  [facility] [phone]  Other Instructions Body Ringworm Body ringworm is an infection of the skin that often causes a ring-shaped rash. Body ringworm is also called tinea corporis. Body ringworm can affect any part of your skin. This condition is easily spread from person to person (is very contagious). What are the causes? This condition is caused by fungi  called dermatophytes. The condition develops when these fungi grow out of control on the skin. You can get this condition if you touch a person or animal that has it. You can also get it if you share any items with an infected person or pet. These include: Clothing, bedding, and towels. Brushes or combs. Gym equipment. Any other object that has the fungus on it. What increases the risk? You are more likely to develop this condition if you: Play sports that involve close physical contact, such as wrestling. Sweat a lot. Live in areas that are hot and humid. Use public showers. Have a weakened disease-fighting system (immune system). What are the signs or symptoms? Symptoms of this condition include: Itchy, raised red spots and bumps. Red scaly patches. A ring-shaped rash. The rash may have: A clear center. Scales or red bumps at its center. Redness near its borders. Dry and scaly skin on or around it. How is this diagnosed? This condition can usually be diagnosed with a skin exam. A skin scraping may be taken from the affected area and examined under a microscope to see if the fungus is present. How is this treated? This condition may be treated with: An antifungal cream or ointment. An antifungal shampoo. Antifungal medicines. These may be prescribed if your ringworm: Is severe. Keeps coming back or lasts a long time. Follow these instructions at home: Take over-the-counter and prescription medicines only as told by your health care provider. If you were given an antifungal cream or ointment:  Use it as told by your health care provider. Wash the infected area and dry it completely before applying the cream or ointment. If you were given an antifungal shampoo: Use it as told by your health care provider. Leave the shampoo on your body for 3-5 minutes before rinsing. While you have a rash: Wear loose clothing to stop clothes from rubbing and irritating it. Wash or change your  bed sheets every night. Wash clothes and bed sheets in hot water. Disinfect or throw out items that may be infected. Wash your hands often with soap and water for at least 20 seconds. If soap and water are not available, use hand sanitizer. If your pet has the same infection, take your pet to see a veterinarian for treatment. How is this prevented? Take a bath or shower every day and after every time you work out or play sports. Dry your skin completely after bathing. Wear sandals or shoes in public places and showers. Wash athletic clothes after each use. Do not share personal items with others. Avoid touching red patches of skin on other people. Avoid touching pets that have bald spots. If you touch an animal that has a bald spot, wash your hands. Contact a health care provider if: Your rash continues to spread after 7 days of treatment. Your rash is not gone in 4 weeks. The area around your rash gets red, warm, tender, and swollen. This information is not intended to replace advice given to you by your health care provider. Make sure you discuss any questions you have with your health care provider. Document Revised: 12/03/2021 Document Reviewed: 12/03/2021 Elsevier Patient Education  2024 [employer]   If you have been instructed to have an in-person evaluation today at a local Urgent Care facility, please use the link below. It will take you to a list of all of our available [facility], including address, phone number and hours of operation. Please do not delay care.  [facility]  If you or a family member do not have a primary care provider, use the link below to schedule a visit and establish care. When you choose a [facility] primary care physician or advanced practice provider, you gain a long-term partner in health. Find a Primary Care Provider  Learn more about [facility]'s in-office and virtual care options: [facility] - Get Care Now
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
